# Patient Record
Sex: Male | Born: 1970 | Race: White | Hispanic: No | Marital: Married | Smoking: Never smoker
Health system: Southern US, Community
[De-identification: ages and names within clinical notes are randomized; demographics above are authoritative.]

## PROBLEM LIST (undated history)

## (undated) DIAGNOSIS — N200 Calculus of kidney: Secondary | ICD-10-CM

## (undated) DIAGNOSIS — R259 Unspecified abnormal involuntary movements: Secondary | ICD-10-CM

## (undated) DIAGNOSIS — J45909 Unspecified asthma, uncomplicated: Secondary | ICD-10-CM

## (undated) DIAGNOSIS — E785 Hyperlipidemia, unspecified: Secondary | ICD-10-CM

## (undated) DIAGNOSIS — T7840XA Allergy, unspecified, initial encounter: Secondary | ICD-10-CM

## (undated) DIAGNOSIS — M545 Low back pain, unspecified: Secondary | ICD-10-CM

## (undated) DIAGNOSIS — K219 Gastro-esophageal reflux disease without esophagitis: Secondary | ICD-10-CM

## (undated) HISTORY — DX: Calculus of kidney: N20.0

## (undated) HISTORY — DX: Low back pain, unspecified: M54.50

## (undated) HISTORY — DX: Allergy, unspecified, initial encounter: T78.40XA

## (undated) HISTORY — DX: Gastro-esophageal reflux disease without esophagitis: K21.9

## (undated) HISTORY — DX: Low back pain: M54.5

## (undated) HISTORY — DX: Hyperlipidemia, unspecified: E78.5

## (undated) HISTORY — DX: Unspecified asthma, uncomplicated: J45.909

## (undated) HISTORY — PX: TONSILLECTOMY: SUR1361

## (undated) HISTORY — DX: Unspecified abnormal involuntary movements: R25.9

---

## 1998-10-24 ENCOUNTER — Encounter: Payer: Self-pay | Admitting: Emergency Medicine

## 1998-10-24 ENCOUNTER — Emergency Department (HOSPITAL_COMMUNITY): Admission: EM | Admit: 1998-10-24 | Discharge: 1998-10-24 | Payer: Self-pay | Admitting: Emergency Medicine

## 2000-03-23 ENCOUNTER — Emergency Department (HOSPITAL_COMMUNITY): Admission: EM | Admit: 2000-03-23 | Discharge: 2000-03-23 | Payer: Self-pay | Admitting: Emergency Medicine

## 2000-12-06 ENCOUNTER — Encounter: Admission: RE | Admit: 2000-12-06 | Discharge: 2000-12-06 | Payer: Self-pay | Admitting: Orthopedic Surgery

## 2000-12-06 ENCOUNTER — Encounter: Payer: Self-pay | Admitting: Orthopedic Surgery

## 2000-12-22 ENCOUNTER — Encounter: Admission: RE | Admit: 2000-12-22 | Discharge: 2000-12-22 | Payer: Self-pay | Admitting: Orthopedic Surgery

## 2000-12-22 ENCOUNTER — Encounter: Payer: Self-pay | Admitting: Orthopedic Surgery

## 2003-09-06 HISTORY — PX: WISDOM TOOTH EXTRACTION: SHX21

## 2004-04-27 ENCOUNTER — Emergency Department (HOSPITAL_COMMUNITY): Admission: EM | Admit: 2004-04-27 | Discharge: 2004-04-27 | Payer: Self-pay | Admitting: Emergency Medicine

## 2004-09-05 HISTORY — PX: BACK SURGERY: SHX140

## 2004-11-12 ENCOUNTER — Ambulatory Visit (HOSPITAL_COMMUNITY): Admission: RE | Admit: 2004-11-12 | Discharge: 2004-11-12 | Payer: Self-pay | Admitting: Orthopaedic Surgery

## 2006-03-12 ENCOUNTER — Emergency Department (HOSPITAL_COMMUNITY): Admission: EM | Admit: 2006-03-12 | Discharge: 2006-03-12 | Payer: Self-pay | Admitting: Emergency Medicine

## 2006-05-22 ENCOUNTER — Ambulatory Visit: Payer: Self-pay | Admitting: Internal Medicine

## 2010-02-02 ENCOUNTER — Encounter: Admission: RE | Admit: 2010-02-02 | Discharge: 2010-02-02 | Payer: Self-pay | Admitting: Orthopedic Surgery

## 2010-09-26 ENCOUNTER — Encounter: Payer: Self-pay | Admitting: Family Medicine

## 2011-01-21 NOTE — Assessment & Plan Note (Signed)
[facility]                               PULMONARY OFFICE NOTE   NAME:[name] [name]                        MRN:          [id number]  DATE:05/22/2006                            DOB:          05-03-1971    REASON FOR CONSULTATION:  Cough.   HISTORY:  A 40 year old white male, never smoker, with paroxysms of cough  over the last six years, previously diagnosed with bronchitis and treated  now with Advair with some benefit.  However, his main symptoms tend to occur  while he is sleeping associated with a sensation of  choking  for which he  sits up with or without the use of inhaler.  It is just as likely to improve  spontaneously as after an inhaler.  He denies any significant reproducible  dyspnea with exertion, but has only these choking spells occurring at  rest.  He says they are almost gone since starting Advair (note that he  has also taking Nexium although consistently).  See below.   PAST MEDICAL HISTORY:  Significant for remote back surgery.   ALLERGIES:  None known.   MEDICATIONS:  1. Advair 250/50 one b.i.d.  2. Nexium 40 mg daily.   SOCIAL HISTORY:  He has never smoked.  He works in Event organiser.   FAMILY HISTORY:  Significant for the absence of respiratory disease  __________ .   REVIEW OF SYSTEMS:  Taken in detail on the work sheet.  Significant for  __________  as outlined above.  The patient states he is feeling quite a  bit better now but still has a sensation of a lump in his throat that  even taking Advair does not seem to help.   PHYSICAL EXAMINATION:  GENERAL:  This is a slightly anxious white male in no  acute distress.  VITAL SIGNS:  Stable.  HEENT:  Significant for the fact that he is chewing mint gum.  Oropharynx,  however, is clear.  NECK:  Supple without cervical adenopathy or tenderness.  Trachea is  midline.  No thyromegaly.  CHEST:  Lung fields perfectly clear bilaterally to auscultation.  HEART:  Regular  rhythm without murmur, gallop or rub.  ABDOMEN:  Soft, benign.  EXTREMITIES:  Warm without calf tenderness, cyanosis or clubbing or edema.   IMPRESSION:  New onset cough dating back approximately six years and in this  never smoker who has gained significant weight as he is in his mid 30s and  also is actively consuming excessive mint, which relaxes the  gastroesophageal valve and places him at risk for reflux.  I cannot identify  any other risk factors for asthma and, therefore, recommend the following:   1. Try taking Nexium perfectly regularly every day before breakfast along      with dietary and lifestyle modifications I have reviewed with him in      writing.  2. Taper off Advair over the next two weeks and then return in four weeks      for methacholine challenge test for definitive diagnosis of whether or      not asthma  is a primary diagnosis or a secondary to reflux.                                   Charlaine Dalton. Sherene Sires, MD, [facility]   MBW/MedQ  DD:  05/23/2006  DT:  05/24/2006  Job #:  [id number]   cc:   [name] [name], M.D.

## 2011-01-21 NOTE — H&P (Signed)
[name], [name] NO.:  [id number]   MEDICAL RECORD NO.:  [id number]          PATIENT TYPE:  OIB   LOCATION:                               FACILITY:  [facility]   PHYSICIAN:  Sharolyn Douglas, M.D.        DATE OF BIRTH:  1970/11/27   DATE OF ADMISSION:  11/12/2004  DATE OF DISCHARGE:                                HISTORY & PHYSICAL   CHIEF COMPLAINT:  Pain in my back and right leg.   HISTORY OF PRESENT ILLNESS:  This 40 year old male seen by [place] for continued  progressive problems concerning pain into his low back and radiation into  right lower extremity.  Patient was injured at work when he was adjusting a  table saw and leaning over.  He had sudden onset of excruciating pain into  his low back to the point where he could not straighten up.  He was unable  to ambulate as well.  He was eventually seen in the emergency room where he  was eventually x-rayed and degenerative disk disease was seen as well as a  herniated nucleus pulposus seen at L4-L5.  The patient has had epidural  steroid injections, anti-inflammatories, analgesics, physical therapy,  muscle relaxers, and modification of his every day activity.  His primary  treatment has been through Dr. Malon Kindle and through Dr. Ethelene Hal.  When we  saw him the patient had ambulation with an antalgic gait to the right.  He  has loss of sensation in the L5 distribution to the right lower extremity.  He has a positive straight leg raise on the right which is aggravated by  dorsal augmentation.  After much consideration and the fact that  conservative treatment has failed it was felt that he would benefit with  surgical intervention and is having a micro endoscopic diskectomy at the L4-  5 level on the right.   PAST MEDICAL HISTORY:  This patient's physician is Dr. __________ [facility] who has cleared him for surgery.   CURRENT MEDICATIONS:  1.  Robaxin.  2.  Ambien p.r.n.   ALLERGIES:   None.   PAST MEDICAL HISTORY:  He has been in good health.  He has a history of  bronchitis about a year ago with no sequelae.   PAST SURGICAL HISTORY:  Wisdom teeth extraction in February 2005.   FAMILY HISTORY:  Noncontributory.   SOCIAL HISTORY:  The patient is married.  He is a Environmental consultant.  Has  no intake of tobacco products.  Has rare intake of ETOH.  His wife, [name],  will be the major caregiver after surgery.   REVIEW OF SYSTEMS:  CNS:  No seizures, shoulder paralysis, double vision.  The patient does have radiculitis consistent with L5 nerve root irritation  from HNP on the right.  CARDIOVASCULAR:  No chest pain, angina, orthopnea.  RESPIRATORY:  No productive cough.  No hemoptysis.  No shortness of breath.  GASTROINTESTINAL:  No nausea, vomiting, melena, bloody stool.  GENITOURINARY:  No discharge, dysuria, hematuria.  MUSCULOSKELETAL:  Primarily in  present illness.   PHYSICAL EXAMINATION:  GENERAL:  Alert, cooperative, friendly 39 year old  white male who is accompanied by his wife.  VITAL SIGNS:  VITAL SIGNS:  Blood pressure 122/82, pulse 80, respirations  12.  HEENT:  Normocephalic.  PERRLA.  EOM intact.  Oropharynx is clear.  CHEST:  Clear to auscultation.  No rhonchi.  No rales.  HEART:  Regular rate and rhythm.  No murmurs are heard.  ABDOMEN:  Soft, nontender.  Liver, spleen not felt.  GENITALIA:  Not done.  Not pertinent to present illness.  RECTAL:  Not done.  Not pertinent to present illness.  EXTREMITIES:  Lower extremities as in present illness above.   ADMITTING DIAGNOSES:  L4-5 herniated nucleus pulposus on the right.   PLAN:  The patient will undergo right L4 micro endoscopic diskectomy.      DLU/MEDQ  D:  11/04/2004  T:  11/04/2004  Job:  [id number]   cc:   Silvestre Moment, M.D.  [facility] at [facility]  fax [phone]

## 2011-01-21 NOTE — Op Note (Signed)
NAMEJAFET, WISSING NO.:  [id number]   MEDICAL RECORD NO.:  [id number]          PATIENT TYPE:  OIB   LOCATION:  2852                         FACILITY:  [facility]   PHYSICIAN:  Sharolyn Douglas, M.D.        DATE OF BIRTH:  1971-05-31   DATE OF PROCEDURE:  11/12/2004  DATE OF DISCHARGE:                                 OPERATIVE REPORT   DIAGNOSIS:  Right L4-5 disk herniation with right lower extremity  radiculopathy.   PROCEDURE:  Right L4-5 microdiskectomy using the ___________ retractor  system.   SURGEON:  Sharolyn Douglas, M.D.   ASSISTANT:  Verlin Fester, P.A.   ANESTHESIA:  General endotracheal.   COMPLICATIONS:  None.   INDICATIONS:  The patient is a pleasant 40 year old male with persistent  right lower extremity radiculopathy secondary to disk herniation at L4-5. He  failed to respond to conservative measures, elected to undergo L4-5 micro  laminotomy and diskectomy in hopes of improving symptoms. Risks and benefits  were reviewed.   PROCEDURE:  The patient was identified in the holding area, taken to the  operating room, underwent general endotracheal anesthesia without  difficulty. Carefully positioned prone on the Wilson frame. All bony  prominences were padded. Face and eyes protected at all times. Back prepped  and draped usual sterile fashion. Fluoroscopy was brought into the field and  the L4-5 level was identified. A 2 cm skin incision made just off the  midline on the right side. Dissection was carried through the deep fascia  sharply. Dilators from the ____________ retractor system was then used to  dock onto the L4-5 interspace using fluoroscopy. The length 50-mm blades  were then attached to the ____________ retractor which was slid over the  final dilator and attached to the operating room table using the attachment  arm. The retractor was spread dilating the paraspinal muscles. The surgical  microscope was then brought into the field. Final  fluoroscopic imaging  confirmed good positioning of the retractor over the L4-5 interspace. A thin  layer of paraspinal muscle removed over the L4 lamina. High-speed bur used  to remove the inferior one third of the L4 lamina. The ligamentum flavum was  removed piecemeal. This exposed the underlying L5 nerve root. The nerve root  was being displaced posteriorly by the underlying disk herniation. The nerve  root was mobilized medially. The subligamentous disk herniation was  identified. Sharp annulotomy completed.  Pituitary rongeurs used to remove  several large fragments of disk material. We then used an Epstein curette to  decompress the remaining disk herniation back into the interspace which  again was removed with pituitaries. The wound was irrigated. We explored the  nerve root and spinal canal with a blunt probe and felt we had a good  decompression. Bleeding was controlled with Gelfoam and bipolar electro  cautery. Fentanyl 2 mL left in the exposed epidural space for  postoperative analgesia. The deep fascia was closed with an 0 Vicryl suture.  Subcutaneous layer closed with 2-0 Vicryl followed by Dermabond on the skin.  The patient was turned supine, extubated  without difficulty and transferred  to recovery in stable condition able to move his upper and lower  extremities.      MC/MEDQ  D:  11/12/2004  T:  11/12/2004  Job:  [id number]

## 2012-04-20 ENCOUNTER — Encounter (INDEPENDENT_AMBULATORY_CARE_PROVIDER_SITE_OTHER): Payer: Self-pay | Admitting: Surgery

## 2012-04-23 ENCOUNTER — Telehealth (INDEPENDENT_AMBULATORY_CARE_PROVIDER_SITE_OTHER): Payer: Self-pay

## 2012-04-23 NOTE — Telephone Encounter (Signed)
Called in wanting sooner appointment because symptoms of nausea with hernia all weekend. Asked if hernia was reducible and states "no bulge to push in to see if reducible". No sooner appointment but told them I would look out for cancellations and none used cancer spots. If symptoms worsens or do not improve, told to go to ER or an urgent care. Also advised to take MOM or Miralax since complaining of a little constipation but bowels are moving. Told them I would have Pattricia Boss look at [facility] schedule to see if any spots for next week can be used and either her or I will call them back.

## 2012-04-26 ENCOUNTER — Encounter (INDEPENDENT_AMBULATORY_CARE_PROVIDER_SITE_OTHER): Payer: Self-pay | Admitting: Surgery

## 2012-04-30 ENCOUNTER — Ambulatory Visit (INDEPENDENT_AMBULATORY_CARE_PROVIDER_SITE_OTHER): Payer: 59 | Admitting: Surgery

## 2012-04-30 ENCOUNTER — Encounter (INDEPENDENT_AMBULATORY_CARE_PROVIDER_SITE_OTHER): Payer: Self-pay | Admitting: Surgery

## 2012-04-30 VITALS — BP 142/88 | HR 68 | Temp 98.0°F | Resp 20 | Ht 70.0 in | Wt 199.0 lb

## 2012-04-30 DIAGNOSIS — K4090 Unilateral inguinal hernia, without obstruction or gangrene, not specified as recurrent: Secondary | ICD-10-CM

## 2012-04-30 NOTE — Progress Notes (Signed)
Patient ID: [name], male   DOB: 1971/03/04, 41 y.o.   MRN: [id number]  Chief Complaint  Patient presents with  . Pre-op Exam    Eval L/H    HPI [name] is a 41 y.o. male.   HPIThis is a pleasant gentleman referred by Dr. Ancil Boozer for evaluation of a inguinal hernia. The patient has noticed a bulge in his left groin several weeks ago. He was in the shower when he noticed it. He reports that it always reduces easily. He has very mild discomfort in the left groin. He has no previous surgical history regarding hernias. He has no complaints regarding his umbilicus or right groin  Past Medical History  Diagnosis Date  . Hyperlipidemia   . GERD (gastroesophageal reflux disease)   . Asthma   . Nephrolithiasis   . Lumbago   . Allergy   . Abnormal involuntary movements     History reviewed. No pertinent past surgical history.  History reviewed. No pertinent family history.  Social History History  Substance Use Topics  . Smoking status: Never Smoker   . Smokeless tobacco: Not on file  . Alcohol Use: Yes    No Known Allergies  Current Outpatient Prescriptions  Medication Sig Dispense Refill  . fish oil-omega-3 fatty acids 1000 MG capsule Take 2 g by mouth daily.      Marland Kitchen ibuprofen (ADVIL,MOTRIN) 200 MG tablet Take 200 mg by mouth every 6 (six) hours as needed.      . Multiple Vitamins-Minerals (MULTIVITAMIN WITH MINERALS) tablet Take 1 tablet by mouth daily.      . ondansetron (ZOFRAN) 4 MG tablet         Review of Systems Review of Systems  Constitutional: Negative for fever, chills and unexpected weight change.  HENT: Negative for hearing loss, congestion, sore throat, trouble swallowing and voice change.   Eyes: Negative for visual disturbance.  Respiratory: Negative for cough and wheezing.   Cardiovascular: Negative for chest pain, palpitations and leg swelling.  Gastrointestinal: Positive for abdominal pain, constipation and abdominal distention. Negative  for nausea, vomiting, diarrhea, blood in stool, anal bleeding and rectal pain.  Genitourinary: Negative for hematuria and difficulty urinating.  Musculoskeletal: Negative for arthralgias.  Skin: Negative for rash and wound.  Neurological: Negative for seizures, syncope, weakness and headaches.  Hematological: Negative for adenopathy. Does not bruise/bleed easily.  Psychiatric/Behavioral: Negative for confusion.    Blood pressure 142/88, pulse 68, temperature 98 F (36.7 C), temperature source Temporal, resp. rate 20, height 5\' 10"  (1.778 m), weight 199 lb (90.266 kg), SpO2 98.00%.  Physical Exam Physical Exam  Constitutional: He is oriented to person, place, and time. He appears well-developed and well-nourished. No distress.  HENT:  Head: Normocephalic and atraumatic.  Right Ear: External ear normal.  Left Ear: External ear normal.  Nose: Nose normal.  Mouth/Throat: Oropharynx is clear and moist. No oropharyngeal exudate.  Eyes: Conjunctivae are normal. Pupils are equal, round, and reactive to light. Right eye exhibits no discharge. Left eye exhibits no discharge. No scleral icterus.  Neck: Normal range of motion. Neck supple. No tracheal deviation present.  Cardiovascular: Normal rate, regular rhythm, normal heart sounds and intact distal pulses.   No murmur heard. Pulmonary/Chest: Effort normal and breath sounds normal. No respiratory distress. He has no wheezes. He has no rales.  Abdominal: Bowel sounds are normal. He exhibits no distension. There is no tenderness. There is no guarding.       There is an easily reducible  left inguinal hernia. There is no evidence of right single hernia or umbilical hernia  Musculoskeletal: Normal range of motion. He exhibits no edema and no tenderness.  Lymphadenopathy:    He has no cervical adenopathy.  Neurological: He is alert and oriented to person, place, and time.  Skin: Skin is warm and dry. No rash noted. He is not diaphoretic. No  erythema.  Psychiatric: His behavior is normal. Judgment normal.    Data Reviewed I have the notes from her primary care physician which I have reviewed  Assessment     Left inguinal hernia  Plan    I discussed the diagnosis of hernia with the patient and his wife. I discussed with the laparoscopic repairs with mesh. I discussed the risk of surgery which includes but is not limited to bleeding, infection, nerve entrapment, chronic pain, recurrence, etc. He understands and wishes to proceed with open repair with mesh. Left inguinal hernia repair with mesh will thus be scheduled. Likelihood of success is good       [name] A 04/30/2012, 3:25 PM

## 2012-05-10 ENCOUNTER — Ambulatory Visit (INDEPENDENT_AMBULATORY_CARE_PROVIDER_SITE_OTHER): Payer: Self-pay | Admitting: Surgery

## 2012-05-11 ENCOUNTER — Encounter (HOSPITAL_BASED_OUTPATIENT_CLINIC_OR_DEPARTMENT_OTHER): Payer: Self-pay | Admitting: *Deleted

## 2012-05-11 NOTE — Progress Notes (Signed)
No cardiac or resp problems Used to have chronic cough-thought was reflux-treated-he chg jobs with less environmental pollution-better

## 2012-05-15 NOTE — H&P (Signed)
Patient ID: [name], male DOB: November 22, 1970, 41 y.o. MRN: [id number]  Chief Complaint   Patient presents with   .  Pre-op Exam     Eval L/H    HPI  [name] is a 41 y.o. male.  HPIThis is a pleasant gentleman referred by Dr. Ancil Boozer for evaluation of a inguinal hernia. The patient has noticed a bulge in his left groin several weeks ago. He was in the shower when he noticed it. He reports that it always reduces easily. He has very mild discomfort in the left groin. He has no previous surgical history regarding hernias. He has no complaints regarding his umbilicus or right groin  Past Medical History   Diagnosis  Date   .  Hyperlipidemia    .  GERD (gastroesophageal reflux disease)    .  Asthma    .  Nephrolithiasis    .  Lumbago    .  Allergy    .  Abnormal involuntary movements     History reviewed. No pertinent past surgical history.  History reviewed. No pertinent family history.  Social History  History   Substance Use Topics   .  Smoking status:  Never Smoker   .  Smokeless tobacco:  Not on file   .  Alcohol Use:  Yes    No Known Allergies  Current Outpatient Prescriptions   Medication  Sig  Dispense  Refill   .  fish oil-omega-3 fatty acids 1000 MG capsule  Take 2 g by mouth daily.     Marland Kitchen  ibuprofen (ADVIL,MOTRIN) 200 MG tablet  Take 200 mg by mouth every 6 (six) hours as needed.     .  Multiple Vitamins-Minerals (MULTIVITAMIN WITH MINERALS) tablet  Take 1 tablet by mouth daily.     .  ondansetron (ZOFRAN) 4 MG tablet       Review of Systems  Review of Systems  Constitutional: Negative for fever, chills and unexpected weight change.  HENT: Negative for hearing loss, congestion, sore throat, trouble swallowing and voice change.  Eyes: Negative for visual disturbance.  Respiratory: Negative for cough and wheezing.  Cardiovascular: Negative for chest pain, palpitations and leg swelling.  Gastrointestinal: Positive for abdominal pain, constipation and  abdominal distention. Negative for nausea, vomiting, diarrhea, blood in stool, anal bleeding and rectal pain.  Genitourinary: Negative for hematuria and difficulty urinating.  Musculoskeletal: Negative for arthralgias.  Skin: Negative for rash and wound.  Neurological: Negative for seizures, syncope, weakness and headaches.  Hematological: Negative for adenopathy. Does not bruise/bleed easily.  Psychiatric/Behavioral: Negative for confusion.   Blood pressure 142/88, pulse 68, temperature 98 F (36.7 C), temperature source Temporal, resp. rate 20, height 5\' 10"  (1.778 m), weight 199 lb (90.266 kg), SpO2 98.00%.  Physical Exam  Physical Exam  Constitutional: He is oriented to person, place, and time. He appears well-developed and well-nourished. No distress.  HENT:  Head: Normocephalic and atraumatic.  Right Ear: External ear normal.  Left Ear: External ear normal.  Nose: Nose normal.  Mouth/Throat: Oropharynx is clear and moist. No oropharyngeal exudate.  Eyes: Conjunctivae are normal. Pupils are equal, round, and reactive to light. Right eye exhibits no discharge. Left eye exhibits no discharge. No scleral icterus.  Neck: Normal range of motion. Neck supple. No tracheal deviation present.  Cardiovascular: Normal rate, regular rhythm, normal heart sounds and intact distal pulses.  No murmur heard.  Pulmonary/Chest: Effort normal and breath sounds normal. No respiratory distress. He has no wheezes.  He has no rales.  Abdominal: Bowel sounds are normal. He exhibits no distension. There is no tenderness. There is no guarding.  There is an easily reducible left inguinal hernia. There is no evidence of right single hernia or umbilical hernia  Musculoskeletal: Normal range of motion. He exhibits no edema and no tenderness.  Lymphadenopathy:  He has no cervical adenopathy.  Neurological: He is alert and oriented to person, place, and time.  Skin: Skin is warm and dry. No rash noted. He is not  diaphoretic. No erythema.  Psychiatric: His behavior is normal. Judgment normal.   Data Reviewed  I have the notes from her primary care physician which I have reviewed  Assessment   Left inguinal hernia  Plan   I discussed the diagnosis of hernia with the patient and his wife. I discussed with the laparoscopic repairs with mesh. I discussed the risk of surgery which includes but is not limited to bleeding, infection, nerve entrapment, chronic pain, recurrence, etc. He understands and wishes to proceed with open repair with mesh. Left inguinal hernia repair with mesh will thus be scheduled. Likelihood of success is good   [place] A

## 2012-05-16 ENCOUNTER — Encounter (HOSPITAL_BASED_OUTPATIENT_CLINIC_OR_DEPARTMENT_OTHER)
Admission: RE | Disposition: A | Discharge status: Home / Self Care | Payer: Self-pay | Source: Ambulatory Visit | Attending: Surgery

## 2012-05-16 ENCOUNTER — Encounter (HOSPITAL_BASED_OUTPATIENT_CLINIC_OR_DEPARTMENT_OTHER): Payer: Self-pay | Admitting: *Deleted

## 2012-05-16 ENCOUNTER — Ambulatory Visit (HOSPITAL_BASED_OUTPATIENT_CLINIC_OR_DEPARTMENT_OTHER): Payer: 59 | Admitting: *Deleted

## 2012-05-16 ENCOUNTER — Encounter (HOSPITAL_BASED_OUTPATIENT_CLINIC_OR_DEPARTMENT_OTHER): Payer: Self-pay

## 2012-05-16 ENCOUNTER — Ambulatory Visit (HOSPITAL_BASED_OUTPATIENT_CLINIC_OR_DEPARTMENT_OTHER)
Admission: RE | Admit: 2012-05-16 | Discharge: 2012-05-16 | Disposition: A | Discharge status: Home / Self Care | Payer: 59 | Source: Ambulatory Visit | Attending: Surgery | Admitting: Surgery

## 2012-05-16 DIAGNOSIS — K4090 Unilateral inguinal hernia, without obstruction or gangrene, not specified as recurrent: Secondary | ICD-10-CM | POA: Insufficient documentation

## 2012-05-16 DIAGNOSIS — K219 Gastro-esophageal reflux disease without esophagitis: Secondary | ICD-10-CM | POA: Insufficient documentation

## 2012-05-16 HISTORY — PX: HERNIA REPAIR: SHX51

## 2012-05-16 HISTORY — PX: INGUINAL HERNIA REPAIR: SHX194

## 2012-05-16 LAB — POCT HEMOGLOBIN-HEMACUE: Hemoglobin: 13.3 g/dL (ref 13.0–17.0)

## 2012-05-16 SURGERY — REPAIR, HERNIA, INGUINAL, ADULT
Anesthesia: General | Site: Groin | Laterality: Left | Wound class: Clean

## 2012-05-16 MED ORDER — DEXAMETHASONE SODIUM PHOSPHATE 4 MG/ML IJ SOLN
INTRAMUSCULAR | Status: DC | PRN
  Administered 2012-05-16: 10 mg via INTRAVENOUS

## 2012-05-16 MED ORDER — OXYCODONE HCL 5 MG PO TABS: 5.0000 mg | ORAL_TABLET | ORAL | Status: DC | PRN

## 2012-05-16 MED ORDER — SODIUM CHLORIDE 0.9 % IJ SOLN: 3.0000 mL | INTRAMUSCULAR | Status: DC | PRN

## 2012-05-16 MED ORDER — SODIUM CHLORIDE 0.9 % IJ SOLN: 3.0000 mL | Freq: Two times a day (BID) | INTRAMUSCULAR | Status: DC

## 2012-05-16 MED ORDER — ACETAMINOPHEN 10 MG/ML IV SOLN
1000.0000 mg | Freq: Once | INTRAVENOUS | Status: AC
  Administered 2012-05-16: 1000 mg via INTRAVENOUS

## 2012-05-16 MED ORDER — MEPERIDINE HCL 25 MG/ML IJ SOLN: 6.2500 mg | INTRAMUSCULAR | Status: DC | PRN

## 2012-05-16 MED ORDER — OXYCODONE HCL 5 MG PO TABS
5.0000 mg | ORAL_TABLET | Freq: Once | ORAL | Status: AC | PRN
  Administered 2012-05-16: 5 mg via ORAL

## 2012-05-16 MED ORDER — ONDANSETRON HCL 4 MG/2ML IJ SOLN: 4.0000 mg | Freq: Four times a day (QID) | INTRAMUSCULAR | Status: DC | PRN

## 2012-05-16 MED ORDER — ACETAMINOPHEN 325 MG PO TABS: 650.0000 mg | ORAL_TABLET | ORAL | Status: DC | PRN

## 2012-05-16 MED ORDER — BUPIVACAINE-EPINEPHRINE PF 0.5-1:200000 % IJ SOLN
INTRAMUSCULAR | Status: DC | PRN
  Administered 2012-05-16: 20 mL

## 2012-05-16 MED ORDER — ACETAMINOPHEN 650 MG RE SUPP: 650.0000 mg | RECTAL | Status: DC | PRN

## 2012-05-16 MED ORDER — CEFAZOLIN SODIUM-DEXTROSE 2-3 GM-% IV SOLR
2.0000 g | INTRAVENOUS | Status: AC
  Administered 2012-05-16: 2 g via INTRAVENOUS

## 2012-05-16 MED ORDER — LACTATED RINGERS IV SOLN
INTRAVENOUS | Status: DC
  Administered 2012-05-16 (×2): via INTRAVENOUS

## 2012-05-16 MED ORDER — HYDROCODONE-ACETAMINOPHEN 5-325 MG PO TABS: 1.0000 | ORAL_TABLET | ORAL | Status: AC | PRN

## 2012-05-16 MED ORDER — MIDAZOLAM HCL 2 MG/2ML IJ SOLN
1.0000 mg | INTRAMUSCULAR | Status: DC | PRN
  Administered 2012-05-16: 2 mg via INTRAVENOUS

## 2012-05-16 MED ORDER — FENTANYL CITRATE 0.05 MG/ML IJ SOLN
INTRAMUSCULAR | Status: DC | PRN
  Administered 2012-05-16: 50 ug via INTRAVENOUS

## 2012-05-16 MED ORDER — SODIUM CHLORIDE 0.9 % IV SOLN: 250.0000 mL | INTRAVENOUS | Status: DC | PRN

## 2012-05-16 MED ORDER — MORPHINE SULFATE 4 MG/ML IJ SOLN: 4.0000 mg | INTRAMUSCULAR | Status: DC | PRN

## 2012-05-16 MED ORDER — ONDANSETRON HCL 4 MG/2ML IJ SOLN
INTRAMUSCULAR | Status: DC | PRN
  Administered 2012-05-16: 4 mg via INTRAVENOUS

## 2012-05-16 MED ORDER — HYDROMORPHONE HCL PF 1 MG/ML IJ SOLN
0.2500 mg | INTRAMUSCULAR | Status: DC | PRN
  Administered 2012-05-16 (×3): 0.5 mg via INTRAVENOUS

## 2012-05-16 MED ORDER — LIDOCAINE HCL (CARDIAC) 20 MG/ML IV SOLN
INTRAVENOUS | Status: DC | PRN
  Administered 2012-05-16: 40 mg via INTRAVENOUS

## 2012-05-16 MED ORDER — OXYCODONE HCL 5 MG/5ML PO SOLN: 5.0000 mg | Freq: Once | ORAL | Status: AC | PRN

## 2012-05-16 MED ORDER — MIDAZOLAM HCL 2 MG/2ML IJ SOLN: 0.5000 mg | Freq: Once | INTRAMUSCULAR | Status: DC | PRN

## 2012-05-16 MED ORDER — PROMETHAZINE HCL 25 MG/ML IJ SOLN: 6.2500 mg | INTRAMUSCULAR | Status: DC | PRN

## 2012-05-16 MED ORDER — FENTANYL CITRATE 0.05 MG/ML IJ SOLN
50.0000 ug | INTRAMUSCULAR | Status: DC | PRN
  Administered 2012-05-16: 100 ug via INTRAVENOUS

## 2012-05-16 MED ORDER — PROPOFOL 10 MG/ML IV BOLUS
INTRAVENOUS | Status: DC | PRN
  Administered 2012-05-16: 200 mg via INTRAVENOUS

## 2012-05-16 MED ORDER — BUPIVACAINE HCL (PF) 0.5 % IJ SOLN
INTRAMUSCULAR | Status: DC | PRN
  Administered 2012-05-16: 10 mL

## 2012-05-16 SURGICAL SUPPLY — 43 items
BENZOIN TINCTURE PRP APPL 2/3 (GAUZE/BANDAGES/DRESSINGS) ×2 IMPLANT
BLADE HEX COATED 2.75 (ELECTRODE) ×2 IMPLANT
BLADE SURG 10 STRL SS (BLADE) ×2 IMPLANT
BLADE SURG ROTATE 9660 (MISCELLANEOUS) ×2 IMPLANT
CHLORAPREP W/TINT 26ML (MISCELLANEOUS) ×2 IMPLANT
CLOTH BEACON ORANGE TIMEOUT ST (SAFETY) ×2 IMPLANT
COVER MAYO STAND STRL (DRAPES) ×2 IMPLANT
COVER TABLE BACK 60X90 (DRAPES) ×2 IMPLANT
DECANTER SPIKE VIAL GLASS SM (MISCELLANEOUS) ×2 IMPLANT
DRAIN PENROSE 1/2X12 LTX STRL (WOUND CARE) ×2 IMPLANT
DRAPE PED LAPAROTOMY (DRAPES) ×2 IMPLANT
DRAPE UTILITY XL STRL (DRAPES) ×2 IMPLANT
DRSG TEGADERM 4X4.75 (GAUZE/BANDAGES/DRESSINGS) ×2 IMPLANT
ELECT REM PT RETURN 9FT ADLT (ELECTROSURGICAL) ×2
ELECTRODE REM PT RTRN 9FT ADLT (ELECTROSURGICAL) ×1 IMPLANT
GAUZE SPONGE 4X4 12PLY STRL LF (GAUZE/BANDAGES/DRESSINGS) ×2 IMPLANT
GLOVE ECLIPSE 6.5 STRL STRAW (GLOVE) ×2 IMPLANT
GLOVE SURG SIGNA 7.5 PF LTX (GLOVE) ×2 IMPLANT
GOWN PREVENTION PLUS XLARGE (GOWN DISPOSABLE) ×2 IMPLANT
GOWN PREVENTION PLUS XXLARGE (GOWN DISPOSABLE) ×2 IMPLANT
MESH PARIETEX PROGRIP LEFT (Mesh General) ×2 IMPLANT
NEEDLE HYPO 22GX1.5 SAFETY (NEEDLE) ×2 IMPLANT
NEEDLE HYPO 25X1 1.5 SAFETY (NEEDLE) ×2 IMPLANT
NS IRRIG 1000ML POUR BTL (IV SOLUTION) IMPLANT
PACK BASIN DAY SURGERY FS (CUSTOM PROCEDURE TRAY) ×2 IMPLANT
PENCIL BUTTON HOLSTER BLD 10FT (ELECTRODE) ×2 IMPLANT
SLEEVE SCD COMPRESS KNEE MED (MISCELLANEOUS) IMPLANT
SPONGE INTESTINAL PEANUT (DISPOSABLE) IMPLANT
SPONGE LAP 4X18 X RAY DECT (DISPOSABLE) ×2 IMPLANT
STRIP CLOSURE SKIN 1/2X4 (GAUZE/BANDAGES/DRESSINGS) ×2 IMPLANT
SUT MNCRL AB 4-0 PS2 18 (SUTURE) ×2 IMPLANT
SUT SILK 2 0 SH (SUTURE) ×2 IMPLANT
SUT SURG 0 T 19/GS 22 1969 62 (SUTURE) IMPLANT
SUT VIC AB 2-0 CT1 27 (SUTURE) ×1
SUT VIC AB 2-0 CT1 TAPERPNT 27 (SUTURE) ×1 IMPLANT
SUT VIC AB 3-0 CT1 27 (SUTURE) ×1
SUT VIC AB 3-0 CT1 27XBRD (SUTURE) ×1 IMPLANT
SUT VICRYL AB 3 0 TIES (SUTURE) ×2 IMPLANT
SYR BULB 3OZ (MISCELLANEOUS) IMPLANT
SYR CONTROL 10ML LL (SYRINGE) ×2 IMPLANT
TOWEL OR 17X24 6PK STRL BLUE (TOWEL DISPOSABLE) ×4 IMPLANT
TOWEL OR NON WOVEN STRL DISP B (DISPOSABLE) ×2 IMPLANT
WATER STERILE IRR 1000ML POUR (IV SOLUTION) ×2 IMPLANT

## 2012-05-16 NOTE — Anesthesia Procedure Notes (Addendum)
Anesthesia Regional Block:  TAP block  Pre-Anesthetic Checklist: ,, timeout performed, Correct Patient, Correct Site, Correct Laterality, Correct Procedure, Correct Position, site marked, Risks and benefits discussed,  Surgical consent,  Pre-op evaluation,  At surgeon's request and post-op pain management  Laterality: Left  Prep: chloraprep       Needles:  Injection technique: Single-shot  Needle Type: Echogenic Needle     Needle Length: 5cm 5 cm Needle Gauge: 22 and 22 G    Additional Needles:  Procedures: ultrasound guided TAP block Narrative:  Start time: 05/16/2012 2:24 PM End time: 05/16/2012 2:31 PM Injection made incrementally with aspirations every 5 mL.  Performed by: Personally  Anesthesiologist: Sandford Craze, MD  Additional Notes: Pt identified in Holding room.  Monitors applied. Working IV access confirmed. Sterile prep L flank.  #22ga Echogenic needle: TAP block with US guidance.  20cc 0.5% Bupivacaine with 1:200k epi injected incrementally after negative test dose.  Patient asymptomatic, VSS, no heme aspirated, tolerated well.   Sandford Craze, MD  TAP block Procedure Name: LMA Insertion Date/Time: 05/16/2012 2:49 PM Performed by: Meyer Russel Pre-anesthesia Checklist: Patient identified, Emergency Drugs available, Suction available and Patient being monitored Patient Re-evaluated:Patient Re-evaluated prior to inductionOxygen Delivery Method: Circle System Utilized Preoxygenation: Pre-oxygenation with 100% oxygen Intubation Type: IV induction Ventilation: Mask ventilation without difficulty LMA: LMA inserted LMA Size: 5.0 Number of attempts: 1 Airway Equipment and Method: bite block Placement Confirmation: positive ETCO2 and breath sounds checked- equal and bilateral Tube secured with: Tape Dental Injury: Teeth and Oropharynx as per pre-operative assessment

## 2012-05-16 NOTE — Transfer of Care (Signed)
Immediate Anesthesia Transfer of Care Note  Patient: [name]  Procedure(s) Performed: Procedure(s) (LRB) with comments: HERNIA REPAIR INGUINAL ADULT (Left) - inguinal area INSERTION OF MESH (Left) - inguinal area  Patient Location: PACU  Anesthesia Type: GA combined with regional for post-op pain  Level of Consciousness: awake, alert  and oriented  Airway & Oxygen Therapy: Patient Spontanous Breathing and Patient connected to face mask oxygen  Post-op Assessment: Report given to PACU RN, Post -op Vital signs reviewed and stable and Patient moving all extremities  Post vital signs: Reviewed and stable  Complications: No apparent anesthesia complications

## 2012-05-16 NOTE — Anesthesia Postprocedure Evaluation (Signed)
  Anesthesia Post-op Note  Patient: [name]  Procedure(s) Performed: Procedure(s) (LRB) with comments: HERNIA REPAIR INGUINAL ADULT (Left) - inguinal area INSERTION OF MESH (Left) - inguinal area  Patient Location: PACU  Anesthesia Type: GA combined with regional for post-op pain  Level of Consciousness: awake, alert , oriented and patient cooperative  Airway and Oxygen Therapy: Patient Spontanous Breathing  Post-op Pain: mild  Post-op Assessment: Post-op Vital signs reviewed, Patient's Cardiovascular Status Stable, Respiratory Function Stable, Patent Airway, No signs of Nausea or vomiting and Pain level controlled  Post-op Vital Signs: Reviewed and stable  Complications: No apparent anesthesia complications

## 2012-05-16 NOTE — Interval H&P Note (Signed)
History and Physical Interval Note:  No change in H and P  05/16/2012 1:17 PM  [name]  has presented today for surgery, with the diagnosis of Left inguinal hernia  The various methods of treatment have been discussed with the patient and family. After consideration of risks, benefits and other options for treatment, the patient has consented to  Procedure(s) (LRB) with comments: HERNIA REPAIR INGUINAL ADULT (Left) INSERTION OF MESH (Left) as a surgical intervention .  The patient's history has been reviewed, patient examined, no change in status, stable for surgery.  I have reviewed the patient's chart and labs.  Questions were answered to the patient's satisfaction.     [name] A

## 2012-05-16 NOTE — H&P (View-Only) (Signed)
Patient ID: [name], male   DOB: 03/10/1971, 41 y.o.   MRN: [id number]  Chief Complaint  Patient presents with  . Pre-op Exam    Eval L/H    HPI [name] is a 41 y.o. male.   HPIThis is a pleasant gentleman referred by Dr. Stephanie Alm for evaluation of a inguinal hernia. The patient has noticed a bulge in his left groin several weeks ago. He was in the shower when he noticed it. He reports that it always reduces easily. He has very mild discomfort in the left groin. He has no previous surgical history regarding hernias. He has no complaints regarding his umbilicus or right groin  Past Medical History  Diagnosis Date  . Hyperlipidemia   . GERD (gastroesophageal reflux disease)   . Asthma   . Nephrolithiasis   . Lumbago   . Allergy   . Abnormal involuntary movements     History reviewed. No pertinent past surgical history.  History reviewed. No pertinent family history.  Social History History  Substance Use Topics  . Smoking status: Never Smoker   . Smokeless tobacco: Not on file  . Alcohol Use: Yes    No Known Allergies  Current Outpatient Prescriptions  Medication Sig Dispense Refill  . fish oil-omega-3 fatty acids 1000 MG capsule Take 2 g by mouth daily.      . ibuprofen (ADVIL,MOTRIN) 200 MG tablet Take 200 mg by mouth every 6 (six) hours as needed.      . Multiple Vitamins-Minerals (MULTIVITAMIN WITH MINERALS) tablet Take 1 tablet by mouth daily.      . ondansetron (ZOFRAN) 4 MG tablet         Review of Systems Review of Systems  Constitutional: Negative for fever, chills and unexpected weight change.  HENT: Negative for hearing loss, congestion, sore throat, trouble swallowing and voice change.   Eyes: Negative for visual disturbance.  Respiratory: Negative for cough and wheezing.   Cardiovascular: Negative for chest pain, palpitations and leg swelling.  Gastrointestinal: Positive for abdominal pain, constipation and abdominal distention. Negative  for nausea, vomiting, diarrhea, blood in stool, anal bleeding and rectal pain.  Genitourinary: Negative for hematuria and difficulty urinating.  Musculoskeletal: Negative for arthralgias.  Skin: Negative for rash and wound.  Neurological: Negative for seizures, syncope, weakness and headaches.  Hematological: Negative for adenopathy. Does not bruise/bleed easily.  Psychiatric/Behavioral: Negative for confusion.    Blood pressure 142/88, pulse 68, temperature 98 F (36.7 C), temperature source Temporal, resp. rate 20, height 5' 10" (1.778 m), weight 199 lb (90.266 kg), SpO2 98.00%.  Physical Exam Physical Exam  Constitutional: He is oriented to person, place, and time. He appears well-developed and well-nourished. No distress.  HENT:  Head: Normocephalic and atraumatic.  Right Ear: External ear normal.  Left Ear: External ear normal.  Nose: Nose normal.  Mouth/Throat: Oropharynx is clear and moist. No oropharyngeal exudate.  Eyes: Conjunctivae are normal. Pupils are equal, round, and reactive to light. Right eye exhibits no discharge. Left eye exhibits no discharge. No scleral icterus.  Neck: Normal range of motion. Neck supple. No tracheal deviation present.  Cardiovascular: Normal rate, regular rhythm, normal heart sounds and intact distal pulses.   No murmur heard. Pulmonary/Chest: Effort normal and breath sounds normal. No respiratory distress. He has no wheezes. He has no rales.  Abdominal: Bowel sounds are normal. He exhibits no distension. There is no tenderness. There is no guarding.       There is an easily reducible   left inguinal hernia. There is no evidence of right single hernia or umbilical hernia  Musculoskeletal: Normal range of motion. He exhibits no edema and no tenderness.  Lymphadenopathy:    He has no cervical adenopathy.  Neurological: He is alert and oriented to person, place, and time.  Skin: Skin is warm and dry. No rash noted. He is not diaphoretic. No  erythema.  Psychiatric: His behavior is normal. Judgment normal.    Data Reviewed I have the notes from her primary care physician which I have reviewed  Assessment     Left inguinal hernia  Plan    I discussed the diagnosis of hernia with the patient and his wife. I discussed with the laparoscopic repairs with mesh. I discussed the risk of surgery which includes but is not limited to bleeding, infection, nerve entrapment, chronic pain, recurrence, etc. He understands and wishes to proceed with open repair with mesh. Left inguinal hernia repair with mesh will thus be scheduled. Likelihood of success is good       Shelle Galdamez A 04/30/2012, 3:25 PM    

## 2012-05-16 NOTE — Op Note (Signed)
HERNIA REPAIR INGUINAL ADULT, INSERTION OF MESH  Procedure Note  [name] 05/16/2012   Pre-op Diagnosis: Left inguinal hernia     Post-op Diagnosis: same  Procedure(s): LEFT INGUINAL HERNIA REPAIR WITH MESH  Surgeon(s): Shelly Rubenstein, MD  Anesthesia: General  Staff:  Circulator Scrub Person Michelle Nasuti, RN - Circulator Idell Pickles, CST - Scrub Person  Estimated Blood Loss: Minimal               Specimens: hernia sac and nodules         Procedure: The patient was brought to the operating room and identified as the correct patient. He was placed supine on the operating room table and general anesthesia was induced. His left lower quadrant and groin were prepped and draped in the usual sterile fashion. I anesthetized the skin with Marcaine. I made a longitudinal incision with the scalpel. I took this down through Scarpa's fascia with the electrocautery. The external oblique fascia was identified and opened with internal and external rings. The testicular cord and structures was controlled a Penrose drain. The hernia sac as well as 2 nodules along the cord were separated from the cord structures. All contents were reduced back into the abdominal cavity. I tied off the base of the sac with a 2-0 silk suture. I then excised the sac and the nodules with the cautery and sent to pathology for evaluation. I then brought a piece of Prolene pariatex progrip mesh onto the field. I placed around the cord structures and fixated it to pubic tubercle with a Vicryl suture. It fit well around the cord structures and secured itself to the inguinal floor. Good coverage of the inguinal floor appeared to be achieved. I then closed the external oblique fascia over the top of this with a running 2-0 Vicryl suture. I anesthetized the skin further with Marcaine. I then closed Scarpa's fascia with interrupted 3-0 Vicryl sutures and closed the skin with a running 4-0 Monocryl. Steri-Strips,  gauze, and Tegaderm were applied. The patient tolerated the procedure well. All the counts were correct at the end of the procedure. The patient was then extubated in the operating room and taken in a stable condition to the recovery room. [name] A   Date: 05/16/2012  Time: 3:19 PM

## 2012-05-16 NOTE — Anesthesia Preprocedure Evaluation (Addendum)
Anesthesia Evaluation  Patient identified by MRN, date of birth, ID band Patient awake    Reviewed: Allergy & Precautions, H&P , NPO status , Patient's Chart, lab work & pertinent test results  History of Anesthesia Complications Negative for: history of anesthetic complications  Airway Mallampati: I TM Distance: >3 FB Neck ROM: Full    Dental  (+) Teeth Intact and Dental Advisory Given   Pulmonary neg pulmonary ROS,  breath sounds clear to auscultation  Pulmonary exam normal       Cardiovascular negative cardio ROS  Rhythm:Regular Rate:Normal     Neuro/Psych negative neurological ROS     GI/Hepatic Neg liver ROS, GERD-  Controlled,  Endo/Other  negative endocrine ROS  Renal/GU Renal diseasenegative Renal ROS     Musculoskeletal   Abdominal   Peds  Hematology   Anesthesia Other Findings   Reproductive/Obstetrics                           Anesthesia Physical Anesthesia Plan  ASA: I  Anesthesia Plan: General   Post-op Pain Management:    Induction: Intravenous  Airway Management Planned: LMA  Additional Equipment:   Intra-op Plan:   Post-operative Plan:   Informed Consent: I have reviewed the patients History and Physical, chart, labs and discussed the procedure including the risks, benefits and alternatives for the proposed anesthesia with the patient or authorized representative who has indicated his/her understanding and acceptance.   Dental advisory given  Plan Discussed with: CRNA and Surgeon  Anesthesia Plan Comments: (Plan routine monitors, GA- LMA OK, TAP block for post op analgesia  )        Anesthesia Quick Evaluation

## 2012-05-16 NOTE — Progress Notes (Signed)
Assisted Dr. Jean Rosenthal with left, ultrasound guided, transabdominal plane block. Side rails up, monitors on throughout procedure. See vital signs in flow sheet. Tolerated Procedure well.

## 2012-05-17 ENCOUNTER — Encounter (HOSPITAL_BASED_OUTPATIENT_CLINIC_OR_DEPARTMENT_OTHER): Payer: Self-pay | Admitting: Surgery

## 2012-05-28 ENCOUNTER — Encounter (INDEPENDENT_AMBULATORY_CARE_PROVIDER_SITE_OTHER): Payer: Self-pay | Admitting: Surgery

## 2012-05-28 ENCOUNTER — Ambulatory Visit (INDEPENDENT_AMBULATORY_CARE_PROVIDER_SITE_OTHER): Payer: 59 | Admitting: Surgery

## 2012-05-28 VITALS — BP 110/82 | HR 72 | Temp 98.7°F | Resp 18 | Ht 71.0 in | Wt 206.0 lb

## 2012-05-28 DIAGNOSIS — Z09 Encounter for follow-up examination after completed treatment for conditions other than malignant neoplasm: Secondary | ICD-10-CM

## 2012-05-28 NOTE — Progress Notes (Signed)
Subjective:     Patient ID: [name], male   DOB: [birth year], 41 y.o.   MRN: [id number]  HPI He is here for his first postop visit status post left inguinal hernia repair with mesh. He is doing well and has no complaints.  Review of Systems     Objective:   Physical Exam On exam, there is minimal swelling in the incision is healing well. There is no evidence of recurrence.    Assessment:     Patient status post left inguinal hernia repair mesh    Plan:     He will return to work on October 7 to full activity.  I will see him back as needed

## 2019-11-15 ENCOUNTER — Ambulatory Visit: Payer: Self-pay | Attending: Internal Medicine

## 2019-11-15 DIAGNOSIS — Z23 Encounter for immunization: Secondary | ICD-10-CM

## 2019-11-15 NOTE — Progress Notes (Signed)
   Covid-19 Vaccination Clinic  Name:  [name]    MRN: [id number] DOB: [birth year]  11/15/2019  Mr. [name] was observed post Covid-19 immunization for 15 minutes without incident. He was provided with Vaccine Information Sheet and instruction to access the V-Safe system.   Mr. [name] was instructed to call 911 with any severe reactions post vaccine: Marland Kitchen Difficulty breathing  . Swelling of face and throat  . A fast heartbeat  . A bad rash all over body  . Dizziness and weakness   Immunizations Administered    Name Date Dose VIS Date Route   Pfizer COVID-19 Vaccine 11/15/2019  3:55 PM 0.3 mL 08/16/2019 Intramuscular   Manufacturer: [employer], [employer]   Lot: [id number]   NDC: [address]-3

## 2019-12-09 ENCOUNTER — Ambulatory Visit: Payer: Self-pay | Attending: Internal Medicine

## 2019-12-09 DIAGNOSIS — Z23 Encounter for immunization: Secondary | ICD-10-CM

## 2019-12-09 NOTE — Progress Notes (Signed)
   Covid-19 Vaccination Clinic  Name:  EH SESAY    MRN: [id number] DOB: July 12, 1971  12/09/2019  Mr. Beavers was observed post Covid-19 immunization for 15 minutes without incident. He was provided with Vaccine Information Sheet and instruction to access the V-Safe system.   Mr. Pollok was instructed to call 911 with any severe reactions post vaccine: Marland Kitchen Difficulty breathing  . Swelling of face and throat  . A fast heartbeat  . A bad rash all over body  . Dizziness and weakness   Immunizations Administered    Name Date Dose VIS Date Route   Pfizer COVID-19 Vaccine 12/09/2019  5:13 PM 0.3 mL 08/16/2019 Intramuscular   Manufacturer: [employer], [employer]   Lot: [id number]   NDC: [address]-9

## 2021-01-30 DIAGNOSIS — U071 COVID-19: Secondary | ICD-10-CM | POA: Diagnosis not present

## 2021-03-19 DIAGNOSIS — Z125 Encounter for screening for malignant neoplasm of prostate: Secondary | ICD-10-CM | POA: Diagnosis not present

## 2021-03-19 DIAGNOSIS — Z0000 Encounter for general adult medical examination without abnormal findings: Secondary | ICD-10-CM | POA: Diagnosis not present

## 2021-03-19 DIAGNOSIS — Z13220 Encounter for screening for lipoid disorders: Secondary | ICD-10-CM | POA: Diagnosis not present

## 2021-03-19 DIAGNOSIS — Z131 Encounter for screening for diabetes mellitus: Secondary | ICD-10-CM | POA: Diagnosis not present

## 2021-03-19 DIAGNOSIS — Z6830 Body mass index (BMI) 30.0-30.9, adult: Secondary | ICD-10-CM | POA: Diagnosis not present

## 2021-07-07 DIAGNOSIS — Z23 Encounter for immunization: Secondary | ICD-10-CM | POA: Diagnosis not present

## 2021-07-07 DIAGNOSIS — M519 Unspecified thoracic, thoracolumbar and lumbosacral intervertebral disc disorder: Secondary | ICD-10-CM | POA: Diagnosis not present

## 2021-07-07 DIAGNOSIS — M5432 Sciatica, left side: Secondary | ICD-10-CM | POA: Diagnosis not present

## 2021-11-06 ENCOUNTER — Other Ambulatory Visit: Payer: Self-pay

## 2021-11-06 ENCOUNTER — Emergency Department (HOSPITAL_COMMUNITY)
Admission: EM | Admit: 2021-11-06 | Discharge: 2021-11-06 | Disposition: A | Discharge status: Home / Self Care | Payer: BC Managed Care – PPO | Attending: Emergency Medicine | Admitting: Emergency Medicine

## 2021-11-06 ENCOUNTER — Encounter (HOSPITAL_COMMUNITY): Payer: Self-pay | Admitting: Emergency Medicine

## 2021-11-06 ENCOUNTER — Emergency Department (HOSPITAL_COMMUNITY): Payer: BC Managed Care – PPO

## 2021-11-06 DIAGNOSIS — I4891 Unspecified atrial fibrillation: Secondary | ICD-10-CM | POA: Diagnosis not present

## 2021-11-06 DIAGNOSIS — R002 Palpitations: Secondary | ICD-10-CM | POA: Diagnosis not present

## 2021-11-06 DIAGNOSIS — E860 Dehydration: Secondary | ICD-10-CM | POA: Diagnosis not present

## 2021-11-06 DIAGNOSIS — I499 Cardiac arrhythmia, unspecified: Secondary | ICD-10-CM | POA: Diagnosis not present

## 2021-11-06 DIAGNOSIS — R000 Tachycardia, unspecified: Secondary | ICD-10-CM | POA: Diagnosis not present

## 2021-11-06 LAB — COMPREHENSIVE METABOLIC PANEL
ALT: 25 U/L (ref 0–44)
AST: 34 U/L (ref 15–41)
Albumin: 4 g/dL (ref 3.5–5.0)
Alkaline Phosphatase: 58 U/L (ref 38–126)
Anion gap: 11 (ref 5–15)
BUN: 17 mg/dL (ref 6–20)
CO2: 20 mmol/L — ABNORMAL LOW (ref 22–32)
Calcium: 8.8 mg/dL — ABNORMAL LOW (ref 8.9–10.3)
Chloride: 107 mmol/L (ref 98–111)
Creatinine, Ser: 0.93 mg/dL (ref 0.61–1.24)
GFR, Estimated: >60 mL/min (ref >60)
Glucose, Bld: 103 mg/dL — ABNORMAL HIGH (ref 70–99)
Potassium: 4.1 mmol/L (ref 3.5–5.1)
Sodium: 138 mmol/L (ref 135–145)
Total Bilirubin: 0.9 mg/dL (ref 0.3–1.2)
Total Protein: 6.7 g/dL (ref 6.5–8.1)

## 2021-11-06 LAB — CBC WITH DIFFERENTIAL/PLATELET
Abs Immature Granulocytes: 0.03 10*3/uL (ref 0.00–0.07)
Basophils Absolute: 0.1 10*3/uL (ref 0.0–0.1)
Basophils Relative: 1 %
Eosinophils Absolute: 0.2 10*3/uL (ref 0.0–0.5)
Eosinophils Relative: 2 %
HCT: 46.4 % (ref 39.0–52.0)
Hemoglobin: 15.7 g/dL (ref 13.0–17.0)
Immature Granulocytes: 0 %
Lymphocytes Relative: 32 %
Lymphs Abs: 2.9 10*3/uL (ref 0.7–4.0)
MCH: 30.1 pg (ref 26.0–34.0)
MCHC: 33.8 g/dL (ref 30.0–36.0)
MCV: 89.1 fL (ref 80.0–100.0)
Monocytes Absolute: 0.9 10*3/uL (ref 0.1–1.0)
Monocytes Relative: 10 %
Neutro Abs: 5 10*3/uL (ref 1.7–7.7)
Neutrophils Relative %: 55 %
Platelets: 324 10*3/uL (ref 150–400)
RBC: 5.21 MIL/uL (ref 4.22–5.81)
RDW: 13 % (ref 11.5–15.5)
WBC: 9.1 10*3/uL (ref 4.0–10.5)
nRBC: 0 % (ref 0.0–0.2)

## 2021-11-06 LAB — TROPONIN I (HIGH SENSITIVITY): Troponin I (High Sensitivity): 6 ng/L (ref <18)

## 2021-11-06 LAB — MAGNESIUM: Magnesium: 2.1 mg/dL (ref 1.7–2.4)

## 2021-11-06 MED ORDER — METOPROLOL TARTRATE 25 MG PO TABS: 25.0000 mg | ORAL_TABLET | Freq: Two times a day (BID) | ORAL | 0 refills | Status: DC | PRN

## 2021-11-06 MED ORDER — LACTATED RINGERS IV BOLUS
1000.0000 mL | Freq: Once | INTRAVENOUS | Status: AC
  Administered 2021-11-06: 1000 mL via INTRAVENOUS

## 2021-11-06 MED ORDER — DILTIAZEM HCL 30 MG PO TABS: 30.0000 mg | ORAL_TABLET | Freq: Three times a day (TID) | ORAL | 0 refills | Status: DC | PRN

## 2021-11-06 MED ORDER — DILTIAZEM HCL ER COATED BEADS 120 MG PO CP24
120.0000 mg | ORAL_CAPSULE | Freq: Once | ORAL | Status: DC
  Filled 2021-11-06: qty 1

## 2021-11-06 NOTE — ED Provider Notes (Signed)
? ?[facility] EMERGENCY DEPARTMENT  ?Provider Note ? ?CSN: [id number] ?Arrival date & time: 11/06/21 2044 ? ?History ?Chief Complaint  ?Patient presents with  ? Atrial Fibrillation  ? ? ?[name] is a 51 y.o. male who presents today with tachycardia.  Patient had sudden onset of tachycardia and palpitations at home.  Found his heart rate to be elevated.  It was not improving on its own.  EMS was called.  EMS found patient to be in atrial fibrillation.  They tried adenosine multiple times, as well as diltiazem.  Diltiazem was slowed the heart rate down to less than 100.  Patient has not had any chest pain.  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  No recent travel.  No leg pain or leg swelling.  No chest pain or shortness of breath. ? ? ?Home Medications ?Prior to Admission medications   ?Medication Sig Start Date End Date Taking? Authorizing Provider  ?metoprolol tartrate (LOPRESSOR) 25 MG tablet Take 1 tablet (25 mg total) by mouth 2 (two) times daily as needed for up to 10 days. 11/06/21 11/16/21 Yes Edison Simon, MD  ?fish oil-omega-3 fatty acids 1000 MG capsule Take 2 g by mouth daily.    [provider]  ?ibuprofen (ADVIL,MOTRIN) 200 MG tablet Take 200 mg by mouth every 6 (six) hours as needed.    [provider]  ?Multiple Vitamins-Minerals (MULTIVITAMIN WITH MINERALS) tablet Take 1 tablet by mouth daily.    [provider]  ?ondansetron (ZOFRAN) 4 MG tablet  04/23/12   [provider]  ? ? ? ?Allergies    ?Patient has no known allergies. ? ? ?Review of Systems   ?Review of Systems  ?Constitutional:  Negative for chills and fever.  ?HENT:  Negative for ear pain and sore throat.   ?Eyes:  Negative for pain and visual disturbance.  ?Respiratory:  Negative for cough and shortness of breath.   ?Cardiovascular:  Positive for palpitations. Negative for chest pain.  ?Gastrointestinal:  Negative for abdominal pain and  vomiting.  ?Genitourinary:  Negative for dysuria and hematuria.  ?Musculoskeletal:  Negative for arthralgias and back pain.  ?Skin:  Negative for color change and rash.  ?Neurological:  Negative for seizures and syncope.  ?All other systems reviewed and are negative. ?Please see HPI for pertinent positives and negatives ? ?Physical Exam ?BP 113/78   Pulse 76   Temp 98.2 ?F (36.8 ?C) (Oral)   Resp 19   SpO2 94%  ? ?Physical Exam ?Vitals and nursing note reviewed.  ?Constitutional:   ?   General: He is not in acute distress. ?   Appearance: He is well-developed.  ?HENT:  ?   Head: Normocephalic and atraumatic.  ?Eyes:  ?   Conjunctiva/sclera: Conjunctivae normal.  ?Cardiovascular:  ?   Rate and Rhythm: Normal rate. Rhythm irregular.  ?   Heart sounds: No murmur heard. ?Pulmonary:  ?   Effort: Pulmonary effort is normal. No respiratory distress.  ?   Breath sounds: Normal breath sounds.  ?Abdominal:  ?   Palpations: Abdomen is soft.  ?   Tenderness: There is no abdominal tenderness.  ?Musculoskeletal:     ?   General: No swelling.  ?   Cervical back: Neck supple.  ?Skin: ?   General: Skin is warm and dry.  ?   Capillary Refill: Capillary refill takes less than 2 seconds.  ?Neurological:  ?   Mental Status: He  is alert.  ?Psychiatric:     ?   Mood and Affect: Mood normal.  ? ? ?ED Results / Procedures / Treatments   ?EKG ?EKG Interpretation ? ?Date/Time:  Saturday November 06 2021 21:48:21 EST ?Ventricular Rate:  81 ?PR Interval:  161 ?QRS Duration: 107 ?QT Interval:  361 ?QTC Calculation: 419 ?R Axis:   84 ?Text Interpretation: Sinus rhythm previous tracing showed atrial fibrillation earlier in the day Confirmed by Richardean Canal [phone]) on 11/06/2021 9:50:13 PM ? ?Procedures ?Procedures ? ?Medications Ordered in the ED ?Medications  ?diltiazem (CARDIZEM CD) 24 hr capsule 120 mg (0 mg Oral Hold 11/06/21 2150)  ?lactated ringers bolus 1,000 mL (0 mLs Intravenous Stopped 11/06/21 2245)  ? ? ? ?ED Course  ? ?  ? ? ?MDM  ? ?This  patient presents to the ED for concern of AFIB, this involves an extensive number of treatment options, and is a complaint that carries with it a high risk of complications and morbidity.  The differential diagnosis includes paroxysmal AFIB, ACS, dehydration. ? ?Additional history obtained: ?Additional history obtained from family and EMS  ?Records reviewed Care Everywhere/External Records and Primary Care Documents ? ?Lab Tests: ?I Ordered, and personally interpreted labs.  The pertinent results include: CBC and CMP were unremarkable.  Troponin was normal. ? ?Imaging Studies ordered: ?I ordered imaging studies including X-ray chest   ?I independently visualized and interpreted imaging which showed acute abnormalities. ?I agree with the radiologist interpretation ? ?EKG (personally reviewed and interpreted): Initial EKG showed atrial fibrillation.  No signs of ST elevation or ischemia. ? ? ?Medical Decision Making: Patient presented with new onset atrial fibrillation.  He was in A-fib with RVR for EMS.  Adenosine was unsuccessful in converting him.  He was given diltiazem, 20 mg IV.  He had his rate slow down to between 80-100.  On arrival he was still in A-fib.  He received just under a liter of fluid with EMS.  He received another liter of fluid here in the emergency room.  He spontaneously converted back into a normal sinus rhythm while receiving fluids.  He never received any further Cardizem here in the emergency department.  At no time did he have any chest pain.  Low suspicion for PE.  Etiology of his new onset A-fib unclear, possibly related to dehydration.  Appears to be paroxysmal as he remained in normal sinus rhythm here after converting spontaneously.  Work-up was unremarkable.  We will plan for discharge home with cardiology follow-up.  Patient sent with a small amount of metoprolol for rate management if he were to flip back into atrial fibrillation.  Patient and family are comfortable with this plan.   Patient safe for discharge home at this time. ? ?Complexity of problems addressed: ?Patient?s presentation is most consistent with  acute presentation with potential threat to life or bodily function ? ?Disposition: ?After consideration of the diagnostic results and the patient?s response to treatment,  ?I feel that the patent would benefit from discharge home .  ? ?Patient seen in conjunction with my attending, Dr. Silverio Lay. ? ? ? ?Final Clinical Impression(s) / ED Diagnoses ?Final diagnoses:  ?Atrial fibrillation with RVR (HCC)  ? ? ?Rx / DC Orders ?ED Discharge Orders   ? ?      Ordered  ?  diltiazem (CARDIZEM) 30 MG tablet  3 times daily PRN,   Status:  Discontinued       ? 11/06/21 2218  ?  metoprolol tartrate (LOPRESSOR)  25 MG tablet  2 times daily PRN       ? 11/06/21 2230  ? ?  ?  ? ?  ? ? ?  ?Edison Simon, MD ?11/06/21 2256 ? ?  ?Charlynne Pander, MD ?11/07/21 1716 ? ?

## 2021-11-06 NOTE — ED Triage Notes (Signed)
Pt BIB [facility] from home, reported feeling his heart beating fast and irregular. Given total of 18mg  adenosine from EMS with little change, rate improved after 20mg  cardizem, HR as high as 190, improved to 80-100 after cardizem. No hx afib. ?

## 2021-11-06 NOTE — Discharge Instructions (Addendum)
Prescription was written for Metoprolol 25mg  tablets.  Please take if heart is greater than 110 and patient is having symptoms.  If heart rate does not improve patient continues to be symptomatic, please return to the emergency department. ?

## 2021-11-09 ENCOUNTER — Ambulatory Visit: Payer: BC Managed Care – PPO | Admitting: Cardiology

## 2021-11-09 ENCOUNTER — Ambulatory Visit (INDEPENDENT_AMBULATORY_CARE_PROVIDER_SITE_OTHER): Payer: BC Managed Care – PPO

## 2021-11-09 ENCOUNTER — Other Ambulatory Visit: Payer: Self-pay

## 2021-11-09 ENCOUNTER — Encounter: Payer: Self-pay | Admitting: Cardiology

## 2021-11-09 VITALS — BP 128/100 | HR 75 | Ht 71.0 in | Wt 225.0 lb

## 2021-11-09 DIAGNOSIS — I480 Paroxysmal atrial fibrillation: Secondary | ICD-10-CM

## 2021-11-09 DIAGNOSIS — R0683 Snoring: Secondary | ICD-10-CM

## 2021-11-09 DIAGNOSIS — I10 Essential (primary) hypertension: Secondary | ICD-10-CM

## 2021-11-09 MED ORDER — DILTIAZEM HCL ER COATED BEADS 120 MG PO CP24: 120.0000 mg | ORAL_CAPSULE | Freq: Every day | ORAL | 3 refills | Status: DC

## 2021-11-09 NOTE — Progress Notes (Unsigned)
Enrolled patient for a 14 day Zio XT  monitor to be mailed to patients home  °

## 2021-11-09 NOTE — Progress Notes (Signed)
Cardiology Office Note:    Date:  11/12/2021   ID:  [name], DOB November 06, 1970, MRN [id number]  PCP:  Vivien Presto, MD  Cardiologist:  Thomasene Ripple, DO  Electrophysiologist:  None   Referring MD: Vivien Presto, MD   "I am fine- I was recently diagnosed with atrial fibrillation"  History of Present Illness:    [name] is a 51 y.o. male with a hx of asthma, GERD, hyperlipidemia initially presented to the [facility] emergency department on November 06, 2021 after he was experiencing significant tachycardia.  He notes that this started abruptly.  And when the heart rate kept going up without any resolution they called EMS.  At the time of EMS presentation the patient was found to be in atrial fibrillation.  He was given adenosine.  Then he was given adenosine and transported to the emergency department.  He was kept on adenosine once his heart rate slowed down he converted spontaneously.  His lab work was ordered and they were within normal limits.  He was given Lopressor as needed and recommended to see cardiology.  He is here today with his wife.  He has not had any recurrent significant palpitations.  He does admit to shortness of breath as well as some fatigue and snoring.  He believes this is his first time he has had symptoms like this.  Because once he felt his heart out of rhythm he got significant symptomatic with shortness of breath.  No other complaints at this time.  Past Medical History:  Diagnosis Date   Abnormal involuntary movements(781.0)    Allergy    Asthma    had hx bronchitis-cough-better now   GERD (gastroesophageal reflux disease)    no meds now-cough gone   Hyperlipidemia    Lumbago    Nephrolithiasis     Past Surgical History:  Procedure Laterality Date   BACK SURGERY  2006   lumb lam   HERNIA REPAIR  05/16/2012   lih repair   INGUINAL HERNIA REPAIR  05/16/2012   Procedure: HERNIA REPAIR INGUINAL ADULT;  Surgeon: Shelly Rubenstein, MD;  Location: [facility];  Service: General;  Laterality: Left;  inguinal area   TONSILLECTOMY     WISDOM TOOTH EXTRACTION  2005    Current Medications: Current Meds  Medication Sig   diltiazem (CARDIZEM CD) 120 MG 24 hr capsule Take 1 capsule (120 mg total) by mouth daily.   famotidine (PEPCID) 20 MG tablet    ibuprofen (ADVIL,MOTRIN) 200 MG tablet Take 200 mg by mouth every 6 (six) hours as needed.   metoprolol tartrate (LOPRESSOR) 25 MG tablet Take 1 tablet (25 mg total) by mouth 2 (two) times daily as needed for up to 10 days.   Multiple Vitamins-Minerals (MULTIVITAMIN WITH MINERALS) tablet Take 1 tablet by mouth daily.     Allergies:   Patient has no known allergies.   Social History   Socioeconomic History   Marital status: Married    Spouse name: Not on file   Number of children: Not on file   Years of education: Not on file   Highest education level: Not on file  Occupational History   Not on file  Tobacco Use   Smoking status: Never   Smokeless tobacco: Not on file  Substance and Sexual Activity   Alcohol use: Yes   Drug use: No   Sexual activity: Not on file  Other Topics Concern   Not on  file  Social History Narrative   Not on file   Social Determinants of Health   Financial Resource Strain: Not on file  Food Insecurity: Not on file  Transportation Needs: Not on file  Physical Activity: Not on file  Stress: Not on file  Social Connections: Not on file     Family History: The patient's family history is not on file.  ROS:   Review of Systems  Constitution: Negative for decreased appetite, fever and weight gain.  HENT: Negative for congestion, ear discharge, hoarse voice and sore throat.   Eyes: Negative for discharge, redness, vision loss in right eye and visual halos.  Cardiovascular: Negative for chest pain, dyspnea on exertion, leg swelling, orthopnea and palpitations.  Respiratory: Negative for cough, hemoptysis,  shortness of breath and snoring.   Endocrine: Negative for heat intolerance and polyphagia.  Hematologic/Lymphatic: Negative for bleeding problem. Does not bruise/bleed easily.  Skin: Negative for flushing, nail changes, rash and suspicious lesions.  Musculoskeletal: Negative for arthritis, joint pain, muscle cramps, myalgias, neck pain and stiffness.  Gastrointestinal: Negative for abdominal pain, bowel incontinence, diarrhea and excessive appetite.  Genitourinary: Negative for decreased libido, genital sores and incomplete emptying.  Neurological: Negative for brief paralysis, focal weakness, headaches and loss of balance.  Psychiatric/Behavioral: Negative for altered mental status, depression and suicidal ideas.  Allergic/Immunologic: Negative for HIV exposure and persistent infections.    EKGs/Labs/Other Studies Reviewed:    The following studies were reviewed today:   EKG:  The ekg ordered today demonstrates sinus rhythm, heart rate 75 beats minute.  Recent Labs: 11/06/2021: ALT 25; BUN 17; Creatinine, Ser 0.93; Hemoglobin 15.7; Magnesium 2.1; Platelets 324; Potassium 4.1; Sodium 138  Recent Lipid Panel No results found for: CHOL, TRIG, HDL, CHOLHDL, VLDL, LDLCALC, LDLDIRECT  Physical Exam:    VS:  BP (!) 128/100   Pulse 75   Ht 5\' 11"  (1.803 m)   Wt 225 lb (102.1 kg)   SpO2 99%   BMI 31.38 kg/m     Wt Readings from Last 3 Encounters:  11/09/21 225 lb (102.1 kg)  05/28/12 206 lb (93.4 kg)  05/16/12 200 lb (90.7 kg)     GEN: Well nourished, well developed in no acute distress HEENT: Normal NECK: No JVD; No carotid bruits LYMPHATICS: No lymphadenopathy CARDIAC: S1S2 noted,RRR, no murmurs, rubs, gallops RESPIRATORY:  Clear to auscultation without rales, wheezing or rhonchi  ABDOMEN: Soft, non-tender, non-distended, +bowel sounds, no guarding. EXTREMITIES: No edema, No cyanosis, no clubbing MUSCULOSKELETAL:  No deformity  SKIN: Warm and dry NEUROLOGIC:  Alert and  oriented x 3, non-focal PSYCHIATRIC:  Normal affect, good insight  ASSESSMENT:    1. PAF (paroxysmal atrial fibrillation) (HCC)   2. Hypertension, unspecified type   3. Morbid obesity (HCC)   4. Snoring    PLAN:    She is in sinus rhythm today.  I was able to educate the patient and his wife about the details of the diagnosis of atrial fibrillation.  We talked about the different strategies for treatment of atrial fibrillation rate control, rhythm control as well as stroke prevention.  He does not require anticoagulation at this time chads Vascor may be likely 1 suspected hypertension but this was an isolated high reading in the office today.  He is in sinus rhythm so we are going to start the patient on low-dose Cardizem which hopefully will help with his blood pressure as well.  He snores and there is some fatigue we will get sleep apnea  test-get a sleep study.  As we also need to understand if this is contributing to his new onset atrial fibrillation.  An echocardiogram will be done to assess for any structural abnormalities.  I will place a ZIO monitor and the patient to understand if he is experiencing any asymptomatic episodes of atrial fibrillation.  The patient is in agreement with the above plan. The patient left the office in stable condition.  The patient will follow up in 12 weeks   Medication Adjustments/Labs and Tests Ordered: Current medicines are reviewed at length with the patient today.  Concerns regarding medicines are outlined above.  Orders Placed This Encounter  Procedures   LONG TERM MONITOR (3-14 DAYS)   EKG 12-Lead   ECHOCARDIOGRAM COMPLETE   Split night study   Meds ordered this encounter  Medications   diltiazem (CARDIZEM CD) 120 MG 24 hr capsule    Sig: Take 1 capsule (120 mg total) by mouth daily.    Dispense:  90 capsule    Refill:  3    Patient Instructions  Medication Instructions:  Your physician has recommended you make the following change  in your medication:  START: Cardizem 120 mg at night *If you need a refill on your cardiac medications before your next appointment, please call your pharmacy*   Lab Work: None If you have labs (blood work) drawn today and your tests are completely normal, you will receive your results only by: MyChart Message (if you have MyChart) OR A paper copy in the mail If you have any lab test that is abnormal or we need to change your treatment, we will call you to review the results.   Testing/Procedures: Your physician has requested that you have an echocardiogram. Echocardiography is a painless test that uses sound waves to create images of your heart. It provides your doctor with information about the size and shape of your heart and how well your hearts chambers and valves are working. This procedure takes approximately one hour. There are no restrictions for this procedure.  Your physician has recommended that you have a sleep study. This test records several body functions during sleep, including: brain activity, eye movement, oxygen and carbon dioxide blood levels, heart rate and rhythm, breathing rate and rhythm, the flow of air through your mouth and nose, snoring, body muscle movements, and chest and belly movement.  ZIO XT- Long Term Monitor Instructions  Your physician has requested you wear a ZIO patch monitor for 14 days.  This is a single patch monitor. [employer] supplies one patch monitor per enrollment. Additional stickers are not available. Please do not apply patch if you will be having a Nuclear Stress Test,  Echocardiogram, Cardiac CT, MRI, or Chest Xray during the period you would be wearing the  monitor. The patch cannot be worn during these tests. You cannot remove and re-apply the  ZIO XT patch monitor.  Your ZIO patch monitor will be mailed 3 day USPS to your address on file. It may take 3-5 days  to receive your monitor after you have been enrolled.  Once you have  received your monitor, please review the enclosed instructions. Your monitor  has already been registered assigning a specific monitor serial # to you.  Billing and Patient Assistance Program Information  We have supplied [employer] with any of your insurance information on file for billing purposes. [employer] offers a sliding scale Patient Assistance Program for patients that do not have  insurance, or whose insurance does not  completely cover the cost of the ZIO monitor.  You must apply for the Patient Assistance Program to qualify for this discounted rate.  To apply, please call [employer] at [phone], select option 4, select option 2, ask to apply for  Patient Assistance Program. Meredeth Ide will ask your household income, and how many people  are in your household. They will quote your out-of-pocket cost based on that information.  [employer] will also be able to set up a 70-month, interest-free payment plan if needed.  Applying the monitor   Shave hair from upper left chest.  Hold abrader disc by orange tab. Rub abrader in 40 strokes over the upper left chest as  indicated in your monitor instructions.  Clean area with 4 enclosed alcohol pads. Let dry.  Apply patch as indicated in monitor instructions. Patch will be placed under collarbone on left  side of chest with arrow pointing upward.  Rub patch adhesive wings for 2 minutes. Remove white label marked "1". Remove the white  label marked "2". Rub patch adhesive wings for 2 additional minutes.  While looking in a mirror, press and release button in center of patch. A small green light will  flash 3-4 times. This will be your only indicator that the monitor has been turned on.  Do not shower for the first 24 hours. You may shower after the first 24 hours.  Press the button if you feel a symptom. You will hear a small click. Record Date, Time and  Symptom in the Patient Logbook.  When you are ready to remove the patch, follow instructions on  the last 2 pages of Patient  Logbook. Stick patch monitor onto the last page of Patient Logbook.  Place Patient Logbook in the blue and white box. Use locking tab on box and tape box closed  securely. The blue and white box has prepaid postage on it. Please place it in the mailbox as  soon as possible. Your physician should have your test results approximately 7 days after the  monitor has been mailed back to [facility].  Call [facility] Customer Care at [phone] if you have questions regarding  your ZIO XT patch monitor. Call them immediately if you see an orange light blinking on your  monitor.  If your monitor falls off in less than 4 days, contact our Monitor department at [phone].  If your monitor becomes loose or falls off after 4 days call [employer] at [phone] for  suggestions on securing your monitor    Follow-Up: At [facility], you and your health needs are our priority.  As part of our continuing mission to provide you with exceptional heart care, we have created designated Provider Care Teams.  These Care Teams include your primary Cardiologist (physician) and Advanced Practice Providers (APPs -  Physician Assistants and Nurse Practitioners) who all work together to provide you with the care you need, when you need it.  We recommend signing up for the patient portal called "MyChart".  Sign up information is provided on this After Visit Summary.  MyChart is used to connect with patients for Virtual Visits (Telemedicine).  Patients are able to view lab/test results, encounter notes, upcoming appointments, etc.  Non-urgent messages can be sent to your provider as well.   To learn more about what you can do with MyChart, go to [email or web address].    Your next appointment:   12 week(s)  The format for your next appointment:   In Person  Provider:   Lavona Mound  Clayton Jarmon, DO     Other Instructions     Adopting a Healthy Lifestyle.  Know what a healthy  weight is for you (roughly BMI <25) and aim to maintain this   Aim for 7+ servings of fruits and vegetables daily   65-80+ fluid ounces of water or unsweet tea for healthy kidneys   Limit to max 1 drink of alcohol per day; avoid smoking/tobacco   Limit animal fats in diet for cholesterol and heart health - choose grass fed whenever available   Avoid highly processed foods, and foods high in saturated/trans fats   Aim for low stress - take time to unwind and care for your mental health   Aim for 150 min of moderate intensity exercise weekly for heart health, and weights twice weekly for bone health   Aim for 7-9 hours of sleep daily   When it comes to diets, agreement about the perfect plan isnt easy to find, even among the experts. Experts at the [facility] of [employer] developed an idea known as the Healthy Eating Plate. Just imagine a plate divided into logical, healthy portions.   The emphasis is on diet quality:   Load up on vegetables and fruits - one-half of your plate: Aim for color and variety, and remember that potatoes dont count.   Go for whole grains - one-quarter of your plate: Whole wheat, barley, wheat berries, quinoa, oats, brown rice, and foods made with them. If you want pasta, go with whole wheat pasta.   Protein power - one-quarter of your plate: Fish, chicken, beans, and nuts are all healthy, versatile protein sources. Limit red meat.   The diet, however, does go beyond the plate, offering a few other suggestions.   Use healthy plant oils, such as olive, canola, soy, corn, sunflower and peanut. Check the labels, and avoid partially hydrogenated oil, which have unhealthy trans fats.   If youre thirsty, drink water. Coffee and tea are good in moderation, but skip sugary drinks and limit milk and dairy products to one or two daily servings.   The type of carbohydrate in the diet is more important than the amount. Some sources of carbohydrates, such as  vegetables, fruits, whole grains, and beans-are healthier than others.   Finally, stay active  Signed, Thomasene Ripple, DO  11/12/2021 8:33 AM    [facility]

## 2021-11-09 NOTE — Patient Instructions (Signed)
Medication Instructions:  ?Your physician has recommended you make the following change in your medication:  ?START: Cardizem 120 mg at night ?*If you need a refill on your cardiac medications before your next appointment, please call your pharmacy* ? ? ?Lab Work: ?None ?If you have labs (blood work) drawn today and your tests are completely normal, you will receive your results only by: ?MyChart Message (if you have MyChart) OR ?A paper copy in the mail ?If you have any lab test that is abnormal or we need to change your treatment, we will call you to review the results. ? ? ?Testing/Procedures: ?Your physician has requested that you have an echocardiogram. Echocardiography is a painless test that uses sound waves to create images of your heart. It provides your doctor with information about the size and shape of your heart and how well your heart?s chambers and valves are working. This procedure takes approximately one hour. There are no restrictions for this procedure. ? ?Your physician has recommended that you have a sleep study. This test records several body functions during sleep, including: brain activity, eye movement, oxygen and carbon dioxide blood levels, heart rate and rhythm, breathing rate and rhythm, the flow of air through your mouth and nose, snoring, body muscle movements, and chest and belly movement. ? ?ZIO XT- Long Term Monitor Instructions ? ?Your physician has requested you wear a ZIO patch monitor for 14 days.  ?This is a single patch monitor. [employer] supplies one patch monitor per enrollment. Additional ?stickers are not available. Please do not apply patch if you will be having a Nuclear Stress Test,  ?Echocardiogram, Cardiac CT, MRI, or Chest Xray during the period you would be wearing the  ?monitor. The patch cannot be worn during these tests. You cannot remove and re-apply the  ?ZIO XT patch monitor.  ?Your ZIO patch monitor will be mailed 3 day USPS to your address on file. It may take  3-5 days  ?to receive your monitor after you have been enrolled.  ?Once you have received your monitor, please review the enclosed instructions. Your monitor  ?has already been registered assigning a specific monitor serial # to you. ? ?Billing and Patient Assistance Program Information ? ?We have supplied [employer] with any of your insurance information on file for billing purposes. ?[employer] offers a sliding scale Patient Assistance Program for patients that do not have  ?insurance, or whose insurance does not completely cover the cost of the ZIO monitor.  ?You must apply for the Patient Assistance Program to qualify for this discounted rate.  ?To apply, please call [employer] at [phone], select option 4, select option 2, ask to apply for  ?Patient Assistance Program. Theodore Demark will ask your household income, and how many people  ?are in your household. They will quote your out-of-pocket cost based on that information.  ?[employer] will also be able to set up a 67-month, interest-free payment plan if needed. ? ?Applying the monitor ?  ?Shave hair from upper left chest.  ?Hold abrader disc by orange tab. Rub abrader in 40 strokes over the upper left chest as  ?indicated in your monitor instructions.  ?Clean area with 4 enclosed alcohol pads. Let dry.  ?Apply patch as indicated in monitor instructions. Patch will be placed under collarbone on left  ?side of chest with arrow pointing upward.  ?Rub patch adhesive wings for 2 minutes. Remove white label marked "1". Remove the white  ?label marked "2". Rub patch adhesive wings for 2 additional minutes.  ?  While looking in a mirror, press and release button in center of patch. A small green light will  ?flash 3-4 times. This will be your only indicator that the monitor has been turned on.  ?Do not shower for the first 24 hours. You may shower after the first 24 hours.  ?Press the button if you feel a symptom. You will hear a small click. Record Date, Time and  ?Symptom in the  Patient Logbook.  ?When you are ready to remove the patch, follow instructions on the last 2 pages of Patient  ?Logbook. Stick patch monitor onto the last page of Patient Logbook.  ?Place Patient Logbook in the blue and white box. Use locking tab on box and tape box closed  ?securely. The blue and white box has prepaid postage on it. Please place it in the mailbox as  ?soon as possible. Your physician should have your test results approximately 7 days after the  ?monitor has been mailed back to [facility].  ?Call [facility] at [phone] if you have questions regarding  ?your ZIO XT patch monitor. Call them immediately if you see an orange light blinking on your  ?monitor.  ?If your monitor falls off in less than 4 days, contact our Monitor department at [phone].  ?If your monitor becomes loose or falls off after 4 days call [employer] at [phone] for  ?suggestions on securing your monitor ? ? ? ?Follow-Up: ?At [facility], you and your health needs are our priority.  As part of our continuing mission to provide you with exceptional heart care, we have created designated Provider Care Teams.  These Care Teams include your primary Cardiologist (physician) and Advanced Practice Providers (APPs -  Physician Assistants and Nurse Practitioners) who all work together to provide you with the care you need, when you need it. ? ?We recommend signing up for the patient portal called "MyChart".  Sign up information is provided on this After Visit Summary.  MyChart is used to connect with patients for Virtual Visits (Telemedicine).  Patients are able to view lab/test results, encounter notes, upcoming appointments, etc.  Non-urgent messages can be sent to your provider as well.   ?To learn more about what you can do with MyChart, go to [email or web address].   ? ?Your next appointment:   ?12 week(s) ? ?The format for your next appointment:   ?In Person ? ?Provider:   ?Berniece Salines, DO    ? ? ?Other Instructions ?  ?

## 2021-11-10 ENCOUNTER — Encounter: Payer: Self-pay | Admitting: Cardiology

## 2021-11-11 DIAGNOSIS — I480 Paroxysmal atrial fibrillation: Secondary | ICD-10-CM

## 2021-11-12 ENCOUNTER — Other Ambulatory Visit: Payer: Self-pay

## 2021-11-12 ENCOUNTER — Telehealth: Payer: Self-pay

## 2021-11-12 MED ORDER — METOPROLOL SUCCINATE ER 25 MG PO TB24: 12.5000 mg | ORAL_TABLET | Freq: Every day | ORAL | 0 refills | Status: DC

## 2021-11-12 NOTE — Telephone Encounter (Signed)
Called patient.  See phone note

## 2021-11-12 NOTE — Telephone Encounter (Signed)
Called pt's wife to discuss the medication changes. It was explained to her that her husband is to stop Cardizem as well as Lopressor. 3 days after stopping Cardizem he is to start Toprol-XL. She voiced concerns about him not having a "rescue medication" I shared the concerns with Dr. Harriet Masson. Dr. Harriet Masson spoke with pt's wife and answered questions and addressed her concerns. No further concerns voiced at this time. Prescription for Toprol-Xl 12.5 mg daily sent to pharmacy.  ?

## 2021-11-17 ENCOUNTER — Encounter: Payer: Self-pay | Admitting: Cardiology

## 2021-11-18 ENCOUNTER — Other Ambulatory Visit: Payer: Self-pay | Admitting: Cardiology

## 2021-11-19 ENCOUNTER — Telehealth: Payer: Self-pay | Admitting: *Deleted

## 2021-11-19 ENCOUNTER — Other Ambulatory Visit: Payer: Self-pay

## 2021-11-19 ENCOUNTER — Ambulatory Visit (HOSPITAL_COMMUNITY): Payer: BC Managed Care – PPO | Attending: Cardiology

## 2021-11-19 DIAGNOSIS — I480 Paroxysmal atrial fibrillation: Secondary | ICD-10-CM | POA: Diagnosis not present

## 2021-11-19 LAB — ECHOCARDIOGRAM COMPLETE
Area-P 1/2: 3.42 cm2
S' Lateral: 3.1 cm

## 2021-11-19 NOTE — Telephone Encounter (Signed)
Notified patient wife of HST appointment details. ?

## 2021-11-22 ENCOUNTER — Encounter: Payer: Self-pay | Admitting: Cardiology

## 2021-11-22 NOTE — Telephone Encounter (Signed)
This matter was handled over the phone, see echo results for documentation.  ?

## 2021-11-26 ENCOUNTER — Encounter: Payer: Self-pay | Admitting: Cardiology

## 2021-11-29 NOTE — Telephone Encounter (Signed)
Spoke with patient and wife regarding sob. Patient becomes sob with normal activities ("getting dressed, intimacy, taking out the trash"). Patient denies PND: however, reported that sometimes while sitting in recliner, it's hard to catch a breath. He does not weight himself, so I gave him instructions of how to weigh each morning. He said he does eat a lot of sodium. Discussed sodium restrictions. He denies chest pain/pressure/squeezing. Spoke with Dr. Flora Lipps (DOD) who recommended that patient be seen with Dr. Servando Salina before his scheduled 5/18 appointment. ?

## 2021-11-30 ENCOUNTER — Telehealth: Payer: Self-pay

## 2021-11-30 NOTE — Telephone Encounter (Signed)
Called pt to set up appt. No answer at this time, left message for him to return the call. Per Dr. Tobb is is okay to move up his appt.  ?

## 2021-11-30 NOTE — Telephone Encounter (Signed)
Called pt to set up appt. No answer at this time, left message for him to return the call. Per Dr. Servando Salina is is okay to move up his appt.  ?

## 2021-12-02 DIAGNOSIS — I480 Paroxysmal atrial fibrillation: Secondary | ICD-10-CM | POA: Diagnosis not present

## 2021-12-06 ENCOUNTER — Other Ambulatory Visit: Payer: Self-pay | Admitting: Cardiology

## 2021-12-20 ENCOUNTER — Encounter: Payer: Self-pay | Admitting: Cardiology

## 2021-12-20 ENCOUNTER — Ambulatory Visit: Payer: BC Managed Care – PPO | Admitting: Cardiology

## 2021-12-20 VITALS — BP 114/82 | HR 67 | Ht 71.0 in | Wt 226.4 lb

## 2021-12-20 DIAGNOSIS — R0602 Shortness of breath: Secondary | ICD-10-CM

## 2021-12-20 DIAGNOSIS — Z8616 Personal history of COVID-19: Secondary | ICD-10-CM | POA: Diagnosis not present

## 2021-12-20 DIAGNOSIS — I480 Paroxysmal atrial fibrillation: Secondary | ICD-10-CM

## 2021-12-20 DIAGNOSIS — Z77098 Contact with and (suspected) exposure to other hazardous, chiefly nonmedicinal, chemicals: Secondary | ICD-10-CM

## 2021-12-20 NOTE — Progress Notes (Signed)
?Cardiology Office Note:   ? ?Date:  12/20/2021  ? ?ID:  [name], DOB April 13, 1971, MRN [id number] ? ?PCP:  Curly Rim, MD  ?Cardiologist:  Berniece Salines, DO  ?Electrophysiologist:  None  ? ?Referring MD: Curly Rim, MD  ? ?" I am doing fine" ? ?History of Present Illness:   ? ?[name] is a 51 y.o. male with a hx of asthma, GERD, hyperlipidemia paroxysmal SVT and paroxysmal atrial fibrillation here today for follow-up visit. ? ?At the first visit he had just been diagnosed with atrial fibrillation we discussed his diagnosis as well as started the patient on low-dose Cardizem as he was also hypertensive.  In the interim he had not been able to tolerate the Cardizem we will transition the patient to metoprolol.   ? ?He has had his monitor as well as echocardiogram.  His sleep study this is pending. ? ?He is having intermittent palpitations but not as bad.  He notes that he has had intermittent shortness of breath.  And is inquiring for a referral to pulmonary. ? ?He is here with his wife and they are both looking forward to their anniversary trip in June. ? ?Past Medical History:  ?Diagnosis Date  ? Abnormal involuntary movements(781.0)   ? Allergy   ? Asthma   ? had hx bronchitis-cough-better now  ? GERD (gastroesophageal reflux disease)   ? no meds now-cough gone  ? Hyperlipidemia   ? Lumbago   ? Nephrolithiasis   ? ? ?Past Surgical History:  ?Procedure Laterality Date  ? BACK SURGERY  2006  ? lumb lam  ? HERNIA REPAIR  05/16/2012  ? lih repair  ? INGUINAL HERNIA REPAIR  05/16/2012  ? Procedure: HERNIA REPAIR INGUINAL ADULT;  Surgeon: Harl Bowie, MD;  Location: [place];  Service: General;  Laterality: Left;  inguinal area  ? TONSILLECTOMY    ? [place] EXTRACTION  2005  ? ? ?Current Medications: ?Current Meds  ?Medication Sig  ? famotidine (PEPCID) 20 MG tablet   ? ibuprofen (ADVIL,MOTRIN) 200 MG tablet Take 200 mg by mouth every 6 (six) hours as needed.  ?  metoprolol succinate (TOPROL-XL) 25 MG 24 hr tablet TAKE 1/2 TABLET BY MOUTH EVERY DAY  ?  ? ?Allergies:   Patient has no known allergies.  ? ?Social History  ? ?Socioeconomic History  ? Marital status: Married  ?  Spouse name: Not on file  ? Number of children: Not on file  ? Years of education: Not on file  ? Highest education level: Not on file  ?Occupational History  ? Not on file  ?Tobacco Use  ? Smoking status: Never  ? Smokeless tobacco: Not on file  ?Substance and Sexual Activity  ? Alcohol use: Yes  ? Drug use: No  ? Sexual activity: Not on file  ?Other Topics Concern  ? Not on file  ?Social History Narrative  ? Not on file  ? ?Social Determinants of Health  ? ?Financial Resource Strain: Not on file  ?Food Insecurity: Not on file  ?Transportation Needs: Not on file  ?Physical Activity: Not on file  ?Stress: Not on file  ?Social Connections: Not on file  ?  ? ?Family History: ?The patient's family history is not on file. ? ?ROS:   ?Review of Systems  ?Constitution: Negative for decreased appetite, fever and weight gain.  ?HENT: Negative for congestion, ear discharge, hoarse voice and sore throat.   ?Eyes: Negative for discharge,  redness, vision loss in right eye and visual halos.  ?Cardiovascular: Negative for chest pain, dyspnea on exertion, leg swelling, orthopnea and palpitations.  ?Respiratory: Negative for cough, hemoptysis, shortness of breath and snoring.   ?Endocrine: Negative for heat intolerance and polyphagia.  ?Hematologic/Lymphatic: Negative for bleeding problem. Does not bruise/bleed easily.  ?Skin: Negative for flushing, nail changes, rash and suspicious lesions.  ?Musculoskeletal: Negative for arthritis, joint pain, muscle cramps, myalgias, neck pain and stiffness.  ?Gastrointestinal: Negative for abdominal pain, bowel incontinence, diarrhea and excessive appetite.  ?Genitourinary: Negative for decreased libido, genital sores and incomplete emptying.  ?Neurological: Negative for brief  paralysis, focal weakness, headaches and loss of balance.  ?Psychiatric/Behavioral: Negative for altered mental status, depression and suicidal ideas.  ?Allergic/Immunologic: Negative for HIV exposure and persistent infections.  ? ? ?EKGs/Labs/Other Studies Reviewed:   ? ?The following studies were reviewed today: ? ? ?EKG:  None today  ? ? ? ?Recent Labs: ?11/06/2021: ALT 25; BUN 17; Creatinine, Ser 0.93; Hemoglobin 15.7; Magnesium 2.1; Platelets 324; Potassium 4.1; Sodium 138  ?Recent Lipid Panel ?No results found for: CHOL, TRIG, HDL, CHOLHDL, VLDL, LDLCALC, LDLDIRECT ? ?Physical Exam:   ? ?VS:  BP 114/82   Pulse 67   Ht 5\' 11"  (1.803 m)   Wt 226 lb 6.4 oz (102.7 kg)   SpO2 95%   BMI 31.58 kg/m?    ? ?Wt Readings from Last 3 Encounters:  ?12/20/21 226 lb 6.4 oz (102.7 kg)  ?11/09/21 225 lb (102.1 kg)  ?05/28/12 206 lb (93.4 kg)  ?  ? ?GEN: Well nourished, well developed in no acute distress ?HEENT: Normal ?NECK: No JVD; No carotid bruits ?LYMPHATICS: No lymphadenopathy ?CARDIAC: S1S2 noted,RRR, no murmurs, rubs, gallops ?RESPIRATORY:  Clear to auscultation without rales, wheezing or rhonchi  ?ABDOMEN: Soft, non-tender, non-distended, +bowel sounds, no guarding. ?EXTREMITIES: No edema, No cyanosis, no clubbing ?MUSCULOSKELETAL:  No deformity  ?SKIN: Warm and dry ?NEUROLOGIC:  Alert and oriented x 3, non-focal ?PSYCHIATRIC:  Normal affect, good insight ? ?ASSESSMENT:   ? ?1. History of COVID-19   ?2. SOB (shortness of breath)   ?3. Occupational exposure to chemicals   ?4. PAF (paroxysmal atrial fibrillation) ([place])   ?5. Morbid obesity ([place])   ? ?PLAN:   ? ? ?1.  He has not had any episodes of A-fib.  We will continue patient on his metoprolol succinate 12.5 mg daily.  No need for any anticoagulation at this time.  CHA2DS2-VASc score is 1. ?2.  He has had some shortness of breath which could be multifactorial he has had history of asthma as well as chemical exposures and in the setting of his history of COVID I  like to refer the patient to pulmonary. ? ?The patient understands the need to lose weight with diet and exercise. We have discussed specific strategies for this. ? ?The patient is in agreement with the above plan. The patient left the office in stable condition.  The patient will follow up in 6 months or sooner if needed. ? ? ?Medication Adjustments/Labs and Tests Ordered: ?Current medicines are reviewed at length with the patient today.  Concerns regarding medicines are outlined above.  ?Orders Placed This Encounter  ?Procedures  ? Ambulatory referral to Pulmonology  ? ?No orders of the defined types were placed in this encounter. ? ? ?Patient Instructions  ?Medication Instructions:  ?Your physician recommends that you continue on your current medications as directed. Please refer to the Current Medication list given to you today.  ?*  If you need a refill on your cardiac medications before your next appointment, please call your pharmacy* ? ? ?Lab Work: ?None ?If you have labs (blood work) drawn today and your tests are completely normal, you will receive your results only by: ?MyChart Message (if you have MyChart) OR ?A paper copy in the mail ?If you have any lab test that is abnormal or we need to change your treatment, we will call you to review the results. ? ? ?Testing/Procedures: ?None ? ? ?Follow-Up: ?At [facility], you and your health needs are our priority.  As part of our continuing mission to provide you with exceptional heart care, we have created designated Provider Care Teams.  These Care Teams include your primary Cardiologist (physician) and Advanced Practice Providers (APPs -  Physician Assistants and Nurse Practitioners) who all work together to provide you with the care you need, when you need it. ? ?We recommend signing up for the patient portal called "MyChart".  Sign up information is provided on this After Visit Summary.  MyChart is used to connect with patients for Virtual Visits  (Telemedicine).  Patients are able to view lab/test results, encounter notes, upcoming appointments, etc.  Non-urgent messages can be sent to your provider as well.   ?To learn more about what you can do with MyChart, go to ht

## 2021-12-20 NOTE — Patient Instructions (Signed)

## 2021-12-27 ENCOUNTER — Ambulatory Visit (HOSPITAL_BASED_OUTPATIENT_CLINIC_OR_DEPARTMENT_OTHER): Payer: BC Managed Care – PPO | Attending: Cardiology | Admitting: Cardiovascular Disease

## 2021-12-27 DIAGNOSIS — I4891 Unspecified atrial fibrillation: Secondary | ICD-10-CM | POA: Diagnosis not present

## 2021-12-27 DIAGNOSIS — G4736 Sleep related hypoventilation in conditions classified elsewhere: Secondary | ICD-10-CM | POA: Diagnosis not present

## 2021-12-27 DIAGNOSIS — G4733 Obstructive sleep apnea (adult) (pediatric): Secondary | ICD-10-CM | POA: Insufficient documentation

## 2021-12-27 DIAGNOSIS — R0683 Snoring: Secondary | ICD-10-CM | POA: Insufficient documentation

## 2022-01-03 ENCOUNTER — Encounter (HOSPITAL_BASED_OUTPATIENT_CLINIC_OR_DEPARTMENT_OTHER): Payer: Self-pay | Admitting: Cardiovascular Disease

## 2022-01-03 NOTE — Procedures (Signed)
? ? ? ?  Patient Name: [name], [name] ?Study Date: 12/27/2021 ?Gender: Male ?D.O.B: [birth year] ?Age (years): 89 ?Referring Provider: Thomasene Ripple DO ?Height (inches): 71 ?Interpreting Physician: Nicki Guadalajara MD, ABSM ?Weight (lbs): 225 ?RPSGT: [place] ?BMI: 31 ?MRN: [id number] ?Neck Size: 17.50 ? ?CLINICAL INFORMATION ?Sleep Study Type: HST ? ?Indication for sleep study: Snoring, PAF, obesity ? ?Epworth Sleepiness Score: 8 ? ?SLEEP STUDY TECHNIQUE ?A multi-channel overnight portable sleep study was performed. The channels recorded were: nasal airflow, thoracic respiratory movement, and oxygen saturation with a pulse oximetry. Snoring was also monitored. ? ?MEDICATIONS ?famotidine (PEPCID) 20 MG tablet ?ibuprofen (ADVIL,MOTRIN) 200 MG tablet ?metoprolol succinate (TOPROL-XL) 25 MG 24 hr tablet  ?Patient self administered medications include: N/A. ? ?SLEEP ARCHITECTURE ?Patient was studied for 411.3 minutes. The sleep efficiency was 100.0 % and the patient was supine for 33.8%. The arousal index was 0.0 per hour. ? ?RESPIRATORY PARAMETERS ?The overall AHI was 33.7 per hour, with a central apnea index of 0 per hour. There is a significant positional component with supine sleep AHI 76.5/h versus non-supine sleep AHI 11.9/h. ? ?The oxygen nadir was 83% during sleep. ? ?CARDIAC DATA ?Mean heart rate during sleep was 62.0 bpm. HR range : 50 - 86 bpm. ? ?IMPRESSIONS ?- Severe obstructive sleep apnea occurred during this study (AHI 33.7/h). ?- Moderate oxygen desaturation to a nadir of 83%. Time < 89% was 10.5 minutes. ?- Patient snored for 36.6 minutes (8.9%) during the sleep. ? ?DIAGNOSIS ?- Obstructive Sleep Apnea (G47.33) ?- Nocturnal Hypoxemia (G47.36) ? ?RECOMMENDATIONS ?- In this patient with cardiovascular comorbidities recommend therapeutic CPAP for treatment of severe sleep disordered breathing. If unable to schedule an in-lab titration, intiate Auto - PAP with EPR of 3 at 7 - 18 cm of water. ?- Effort  should be made to optimize nasal and oropharyngeal patency. ?- Positional therapy avoiding supine position during sleep. ?- Avoid alcohol, sedatives and other CNS depressants that may worsen sleep apnea and disrupt normal sleep architecture. ?- Sleep hygiene should be reviewed to assess factors that may improve sleep quality. ?- Weight management (BMI 31) and regular exercise should be initiated or continued. ?- Recommend a download and sleep clinic evaluation after one month of therapy. ? ? ?[Electronically signed] 01/03/2022 02:06 PM ? ?Nicki Guadalajara MD, [facility], ABSM ?Diplomate, Biomedical engineer of Sleep Medicine ? ?NPI: [id number] ? ?[facility] ?PH: [phone]   FX: [phone] ?ACCREDITED BY THE AMERICAN ACADEMY OF SLEEP MEDICINE ? ?

## 2022-01-04 ENCOUNTER — Other Ambulatory Visit: Payer: Self-pay | Admitting: Cardiovascular Disease

## 2022-01-04 ENCOUNTER — Telehealth: Payer: Self-pay | Admitting: *Deleted

## 2022-01-04 DIAGNOSIS — G4736 Sleep related hypoventilation in conditions classified elsewhere: Secondary | ICD-10-CM

## 2022-01-04 DIAGNOSIS — G4733 Obstructive sleep apnea (adult) (pediatric): Secondary | ICD-10-CM

## 2022-01-04 NOTE — Telephone Encounter (Signed)
-----   Message from Lennette Bihari, MD sent at 01/03/2022  2:11 PM EDT ----- ?Burna Mortimer please notify pt of results, set up for CPAP titration/Auto-PAP ?

## 2022-01-04 NOTE — Telephone Encounter (Signed)
Both the patient and wife were informed of his sleep study results and recommendations. After much discussion, and answering his questions he decides to move forward with Dr Landry Dyke recommendations. PA will be submitted to [facility] for CPAP titration. ?

## 2022-01-07 ENCOUNTER — Telehealth: Payer: Self-pay | Admitting: *Deleted

## 2022-01-07 NOTE — Telephone Encounter (Signed)
Prior Authorization for CPAP titration sent to [facility] via web portal. It was denied. APAP and supplies were approved. Order has been sent to [facility]. Patient notified. ?

## 2022-01-09 ENCOUNTER — Encounter: Payer: Self-pay | Admitting: Cardiology

## 2022-01-14 ENCOUNTER — Encounter: Payer: Self-pay | Admitting: Pulmonary Disease

## 2022-01-14 ENCOUNTER — Ambulatory Visit: Payer: BC Managed Care – PPO | Admitting: Pulmonary Disease

## 2022-01-14 VITALS — BP 118/64 | HR 63 | Ht 71.0 in | Wt 223.2 lb

## 2022-01-14 DIAGNOSIS — G4733 Obstructive sleep apnea (adult) (pediatric): Secondary | ICD-10-CM | POA: Diagnosis not present

## 2022-01-14 DIAGNOSIS — R0602 Shortness of breath: Secondary | ICD-10-CM | POA: Diagnosis not present

## 2022-01-14 NOTE — Progress Notes (Signed)
Synopsis: Referred in May 2023 for shortness of breath  Subjective:   PATIENT ID: [name] GENDER: male DOB: Jan 24, 1971, MRN: [id number]  HPI  Chief Complaint  Patient presents with   Consult    Referred by cardiology for increased SOB over the past 6 months. Notices it more with exertion. 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    [name] is a 51 year old male, never smoker with severe obstructive sleep apnea, paroxysmal atrial fibrillation and obesity who is referred to pulmonary clinic for shortness of breath.   He reports having shortness of breath over the past 6 months. The shortness of breath is mainly with exertion. He denies cough or wheezing. He reports the dyspnea has been more noticeable since having covid infection last year.   He has history of frequent episodes of bronchitis and coughing fits when he was working for a [employer] where he was exposed to lots of dusts. He was previously on a maintenance inhaler at that time.   He has been diagnosed with severe obstructive sleep apnea and will be starting CPAP therapy soon. He reports significant weight gain and inactivity since the pandemic started.  He is a never smoker. He lives with with his wife and children.  Past Medical History:  Diagnosis Date   Abnormal involuntary movements(781.0)    Allergy    Asthma    had hx bronchitis-cough-better now   GERD (gastroesophageal reflux disease)    no meds now-cough gone   Hyperlipidemia    Lumbago    Nephrolithiasis      No family history on file.   Social History   Socioeconomic History   Marital status: Married    Spouse name: Not on file   Number of children: Not on file   Years of education: Not on file   Highest education level: Not on file  Occupational History   Not on file  Tobacco Use   Smoking status: Never   Smokeless tobacco: Not on file   Substance and Sexual Activity   Alcohol use: Yes   Drug use: No   Sexual activity: Not on file  Other Topics Concern   Not on file  Social History Narrative   Not on file   Social Determinants of Health   Financial Resource Strain: Not on file  Food Insecurity: Not on file  Transportation Needs: Not on file  Physical Activity: Not on file  Stress: Not on file  Social Connections: Not on file  Intimate Partner Violence: Not on file     No Known Allergies   Outpatient Medications Prior to Visit  Medication Sig Dispense Refill   famotidine (PEPCID) 20 MG tablet      ibuprofen (ADVIL,MOTRIN) 200 MG tablet Take 200 mg by mouth every 6 (six) hours as needed.     metoprolol succinate (TOPROL-XL) 25 MG 24 hr tablet TAKE 1/2 TABLET BY MOUTH EVERY DAY 45 tablet 3   No facility-administered medications prior to visit.   Review of Systems  Constitutional:  Negative for chills, fever, malaise/fatigue and weight loss.  HENT:  Negative for congestion, sinus pain and sore throat.   Eyes: Negative.   Respiratory:  Positive for shortness of breath. Negative for cough, hemoptysis, sputum production and wheezing.   Cardiovascular:  Negative for chest pain, palpitations, orthopnea, claudication and leg swelling.  Gastrointestinal:  Positive for heartburn. Negative for abdominal pain,  nausea and vomiting.  Genitourinary: Negative.   Musculoskeletal:  Negative for joint pain and myalgias.  Skin:  Negative for rash.  Neurological:  Negative for weakness.  Endo/Heme/Allergies: Negative.   Psychiatric/Behavioral: Negative.     Objective:   Vitals:   01/14/22 1529  BP: 118/64  Pulse: 63  SpO2: 98%  Weight: 223 lb 3.2 oz (101.2 kg)  Height: 5\' 11"  (1.803 m)    Physical Exam Constitutional:      General: He is not in acute distress.    Appearance: He is obese.  HENT:     Head: Normocephalic and atraumatic.  Eyes:     Extraocular Movements: Extraocular movements intact.      Conjunctiva/sclera: Conjunctivae normal.     Pupils: Pupils are equal, round, and reactive to light.  Cardiovascular:     Rate and Rhythm: Normal rate and regular rhythm.     Pulses: Normal pulses.     Heart sounds: Normal heart sounds. No murmur heard. Pulmonary:     Effort: Pulmonary effort is normal. No respiratory distress.     Breath sounds: Normal breath sounds. No wheezing or rhonchi.  Abdominal:     General: Bowel sounds are normal.     Palpations: Abdomen is soft.  Musculoskeletal:     Right lower leg: No edema.     Left lower leg: No edema.  Lymphadenopathy:     Cervical: No cervical adenopathy.  Skin:    General: Skin is warm and dry.  Neurological:     General: No focal deficit present.     Mental Status: He is alert.  Psychiatric:        Mood and Affect: Mood normal.        Behavior: Behavior normal.        Thought Content: Thought content normal.        Judgment: Judgment normal.   CBC    Component Value Date/Time   WBC 9.1 11/06/2021 2052   RBC 5.21 11/06/2021 2052   HGB 15.7 11/06/2021 2052   HCT 46.4 11/06/2021 2052   PLT 324 11/06/2021 2052   MCV 89.1 11/06/2021 2052   MCH 30.1 11/06/2021 2052   MCHC 33.8 11/06/2021 2052   RDW 13.0 11/06/2021 2052   LYMPHSABS 2.9 11/06/2021 2052   MONOABS 0.9 11/06/2021 2052   EOSABS 0.2 11/06/2021 2052   BASOSABS 0.1 11/06/2021 2052      Latest Ref Rng & Units 11/06/2021    8:52 PM  BMP  Glucose 70 - 99 mg/dL 454103    BUN 6 - 20 mg/dL 17    Creatinine 0.980.61 - 1.24 mg/dL 1.190.93    Sodium 147135 - 829145 mmol/L 138    Potassium 3.5 - 5.1 mmol/L 4.1    Chloride 98 - 111 mmol/L 107    CO2 22 - 32 mmol/L 20    Calcium 8.9 - 10.3 mg/dL 8.8     Chest imaging: CXR 11/06/21 The heart size and mediastinal contours are within normal limits. Both lungs are clear. The visualized skeletal structures are unremarkable.  PFT:     View : No data to display.          Labs:  Path:  Echo:  Heart  Catheterization:  Assessment & Plan:   Shortness of breath - Plan: CT Chest High Resolution  Severe obstructive sleep apnea  Discussion: [name] EuropeDwight [name] is a 51 year old male, never smoker with severe obstructive sleep apnea, paroxysmal atrial fibrillation and obesity who is referred  to pulmonary clinic for shortness of breath.   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 He does not appear to have reactive airways symptoms at this time. Untreated sleep apnea may also be contributing to his dyspnea.  We will check a HRCT Chest for further evaluation.   Follow up in 2 months for pulmonary function tests.  Melody Comas, MD [facility] Office: [phone]   Current Outpatient Medications:    famotidine (PEPCID) 20 MG tablet, , Disp: , Rfl:    ibuprofen (ADVIL,MOTRIN) 200 MG tablet, Take 200 mg by mouth every 6 (six) hours as needed., Disp: , Rfl:    metoprolol succinate (TOPROL-XL) 25 MG 24 hr tablet, TAKE 1/2 TABLET BY MOUTH EVERY DAY, Disp: 45 tablet, Rfl: 3

## 2022-01-14 NOTE — Patient Instructions (Signed)
We will check a high resolution CT Chest scan and pulmonary function tests to evaluate your shortness of breath ? ?We will see how you tolerate the CPAP machine before referring to an ENT for further evaluation of the nose and sinuses. ? ?Follow up in 2 months.  ?

## 2022-01-20 ENCOUNTER — Ambulatory Visit: Payer: BC Managed Care – PPO | Admitting: Cardiology

## 2022-01-21 ENCOUNTER — Encounter: Payer: Self-pay | Admitting: Cardiology

## 2022-01-26 ENCOUNTER — Encounter: Payer: Self-pay | Admitting: Pulmonary Disease

## 2022-01-27 ENCOUNTER — Encounter: Payer: Self-pay | Admitting: Pulmonary Disease

## 2022-01-27 NOTE — Telephone Encounter (Signed)
The auth location has been updated.

## 2022-01-27 NOTE — Telephone Encounter (Signed)
Appointment is moved to [facility] wife is aware of new appt

## 2022-01-27 NOTE — Telephone Encounter (Signed)
PCC's, pt has cancelled his HRCT due to out pocket cost. The pt's wife thinks this cost may be due to the imaging location. Is this true and is there another location that pt can have scan that would be cheaper? Thanks.

## 2022-01-28 ENCOUNTER — Ambulatory Visit (HOSPITAL_BASED_OUTPATIENT_CLINIC_OR_DEPARTMENT_OTHER): Payer: BC Managed Care – PPO

## 2022-01-29 ENCOUNTER — Telehealth: Payer: Self-pay | Admitting: Physician Assistant

## 2022-01-29 NOTE — Telephone Encounter (Signed)
Paged by answering service.  Patient is diagnosed with COVID this morning.  He got from family.  Recent diagnosis of atrial fibrillation and undergoing pulmonary work-up as well. Wife called to make sure it safe to take Paxlovid with metoprolol.  Reassurance given.  Also discussed physiology of atrial fibrillation as well as COVID.  If persistent or worsening palpitation patient will take full tablet of Toprol-XL.  If alarming symptoms, comes to ER for further evaluation.

## 2022-01-30 ENCOUNTER — Encounter: Payer: Self-pay | Admitting: Pulmonary Disease

## 2022-02-02 ENCOUNTER — Ambulatory Visit (INDEPENDENT_AMBULATORY_CARE_PROVIDER_SITE_OTHER)
Admission: RE | Admit: 2022-02-02 | Discharge: 2022-02-02 | Disposition: A | Discharge status: Home / Self Care | Payer: BC Managed Care – PPO | Source: Ambulatory Visit | Attending: Pulmonary Disease | Admitting: Pulmonary Disease

## 2022-02-02 DIAGNOSIS — R0602 Shortness of breath: Secondary | ICD-10-CM

## 2022-02-02 DIAGNOSIS — J8489 Other specified interstitial pulmonary diseases: Secondary | ICD-10-CM | POA: Diagnosis not present

## 2022-02-02 DIAGNOSIS — R918 Other nonspecific abnormal finding of lung field: Secondary | ICD-10-CM | POA: Diagnosis not present

## 2022-02-02 DIAGNOSIS — I2510 Atherosclerotic heart disease of native coronary artery without angina pectoris: Secondary | ICD-10-CM | POA: Diagnosis not present

## 2022-02-03 ENCOUNTER — Encounter: Payer: Self-pay | Admitting: Pulmonary Disease

## 2022-02-04 NOTE — Telephone Encounter (Signed)
Pt spouse states she is concerned about ct scan results and wants a phone call back before the weekend. Pt is following up from a mychart message on 02/03/22

## 2022-02-08 ENCOUNTER — Telehealth: Payer: Self-pay | Admitting: Internal Medicine

## 2022-02-08 ENCOUNTER — Telehealth: Payer: Self-pay | Admitting: Cardiology

## 2022-02-08 NOTE — Telephone Encounter (Signed)
Returned call to patients wife (okay per DPR) who states that patient went into Atrial fib last night around 10:40, patient denies any shortness of breath, chest pain, or dizziness. Patients wife reports that patient just feels a little jittery and can feel the palpitations/fluttering. Patient wife states they called the on call last night who advised patient to take full tablet of metoprolol and to call back today if patient did not convert back to sinus to get order for pill in pocket (dilt or Flecainide) Per patients wife patient's HR has been anywhere between 70-97 but mostly in the 90's. Patients wife would like to know if we can send in medication for patient- patient is currently on  vacation in [place]. Confirmed with patient's wife that patient went into Atrial fib around 10:40pm and also patient has never had dose of Flecainide previously. Patients wife states she just wants a pill to get patient back into rhythm so patient can finish his vacation.   Spoke with Dr. Jens Som (DOD) who states that patient will have to be seen in the ER for first dose of flecainide for monitoring. Since patient is in the 24 hour time frame Dr. Jens Som recommends patient go to ER for possible Flecainide dose for conversion back to sinus.   Spoke with patients wife who states that she is NOT taking patient to ER and states she does not feel like he needs to be seen in the ER. Patients wife would like to know if patient can just continue his Metoprolol at a whole tablet (25 mg) until he returns from his trip. Spoke with DOD who states patient can continue to take a whole tablet of metoprolol succinate 25mg  but states that patient should report to the ER if symptoms were to either worsen in any way or new symptoms develop.   When ending the phone call patient states that he feels like he has converted back to sinus as he no longer feels any palpitations and HR is 70. Patients wife would like to know if patient should  continue the Metoprolol 25mg  as she feels that this has helped the patient. Advised patient's wife I would forward message over to Dr. for her to review and advise. Patient's wife verbalized understanding.   Scheduled patient an appointment with Dr. on 6/13 to discuss treatment plan.    Will forward to MD.

## 2022-02-08 NOTE — Telephone Encounter (Signed)
   Patient c/o Palpitations:  High priority if patient c/o lightheadedness, shortness of breath, or chest pain  How long have you had palpitations/irregular HR/ Afib? Started last night   Are you having the symptoms now? yes   Are you currently experiencing lightheadedness, SOB or CP? no  Do you have a history of afib (atrial fibrillation) or irregular heart rhythm? yes  Have you checked your BP or HR? (document readings if available): yes but wife did not provide numbers . She just said his HR was low   Are you experiencing any other symptoms? Fatigue  Patient is on Vacation in [place] and palpitations started last night. The Wife called an On Call MD and was told by the on call MD to have his Cardiologist prescribe a "Pill in a pocket" . Please send medication to [id number] - [place], [place] - [address] AT [facility] & [address]

## 2022-02-08 NOTE — Telephone Encounter (Signed)
I was called by the patient's wife to inform me that the patient was back in atrial fibrillation, but not in RVR. He was overall without major symptoms except for jitter / anxiety. I initially recommended that the patient be evaluated in the emergency department however they are in [place] vacation in the mountains.   Given that he is currently tolerating afib, he took an extra metoprolol. My recommendations were to take a full tablet metop XL 25 mg in the AM. If the afib persists, then they can call back to consider flecanide or diltiazem.  I gave them the red flag symptoms to call 911 to go to the emergency department and they stated understanding.  Gerre Scull, MD Cardiology

## 2022-02-10 ENCOUNTER — Encounter: Payer: Self-pay | Admitting: Cardiology

## 2022-02-11 DIAGNOSIS — G4733 Obstructive sleep apnea (adult) (pediatric): Secondary | ICD-10-CM | POA: Diagnosis not present

## 2022-02-15 ENCOUNTER — Encounter: Payer: Self-pay | Admitting: Cardiology

## 2022-02-15 ENCOUNTER — Ambulatory Visit: Payer: BC Managed Care – PPO | Admitting: Cardiology

## 2022-02-16 NOTE — Telephone Encounter (Signed)
Returned call to wife to offer phone visit for tomorrow 6/15 at 9:40 am. Wife began to raise voice and stated "I have already told yall that my husband can not have a visit in the morning, because he can't miss any more time from work!" Wife said she feels doctor Tobb no longer has any time for her pts and will discuss with her husband tonight regarding switching to Dr. Jacques Navy. Wife stated she only needed two questions answered and is very frustrated pt is required a phone or in person visit just to answer questions.   Nurse explained triage process and also the process for switching providers. Wife again stated she will discuss with husband tonight and send a message via mychart.

## 2022-02-22 ENCOUNTER — Encounter: Payer: Self-pay | Admitting: Cardiology

## 2022-03-01 ENCOUNTER — Telehealth (HOSPITAL_BASED_OUTPATIENT_CLINIC_OR_DEPARTMENT_OTHER): Payer: Self-pay

## 2022-03-11 ENCOUNTER — Encounter: Payer: Self-pay | Admitting: Physician Assistant

## 2022-03-11 ENCOUNTER — Ambulatory Visit: Payer: BC Managed Care – PPO | Admitting: Physician Assistant

## 2022-03-11 VITALS — BP 126/86 | HR 60 | Ht 70.0 in | Wt 229.0 lb

## 2022-03-11 DIAGNOSIS — E785 Hyperlipidemia, unspecified: Secondary | ICD-10-CM

## 2022-03-11 DIAGNOSIS — I480 Paroxysmal atrial fibrillation: Secondary | ICD-10-CM | POA: Diagnosis not present

## 2022-03-11 MED ORDER — METOPROLOL SUCCINATE ER 25 MG PO TB24: 25.0000 mg | ORAL_TABLET | Freq: Every day | ORAL | 3 refills | Status: DC

## 2022-03-11 NOTE — Patient Instructions (Signed)
Medication Instructions:  INCREASE Metoprolol Succinate to 25 mg daily *If you need a refill on your cardiac medications before your next appointment, please call your pharmacy*  Lab Work: Your physician recommends that you return for lab work in TODAY:  Lipid panel LP(a)   If you have labs (blood work) drawn today and your tests are completely normal, you will receive your results only by: MyChart Message (if you have MyChart) OR A paper copy in the mail If you have any lab test that is abnormal or we need to change your treatment, we will call you to review the results.  Testing/Procedures: NONE ordered at this time of appointment   Follow-Up: At [facility], you and your health needs are our priority.  As part of our continuing mission to provide you with exceptional heart care, we have created designated Provider Care Teams.  These Care Teams include your primary Cardiologist (physician) and Advanced Practice Providers (APPs -  Physician Assistants and Nurse Practitioners) who all work together to provide you with the care you need, when you need it.    Your next appointment:   1 month(s)  The format for your next appointment:   In Person  Provider:   Azalee Course, PA-C on a day Dr. Servando Salina is in office         Other Instructions   Important Information About Sugar

## 2022-03-11 NOTE — Progress Notes (Signed)
Cardiology Office Note:    Date:  03/13/2022   ID:  [name], DOB [birth year], MRN [id number]  PCP:  Vivien Presto, MD   [facility] Providers Cardiologist:  Thomasene Ripple, DO     Referring MD: Vivien Presto, MD   Chief Complaint  Patient presents with   Follow-up    Seen for Dr. Servando Salina    History of Present Illness:    [name] is a 51 y.o. male with a hx of asthma, GERD, HLD, PSVT and paroxysmal atrial fibrillation.  Patient was first seen in March 2023 after being diagnosed with atrial fibrillation.  He was intolerant of diltiazem and was later switched to metoprolol succinate for rate control.  Echocardiogram obtained on 11/19/2021 showed EF 60 to 65%, mild AI.  Heart monitor for 14 days demonstrated minimal heart rate 44 bpm, maximum heart rate 169 bpm, average heart rate 72 bpm, predominant rhythm was sinus rhythm 1 9 SVT, fastest interval 12 beats at a maximal heart rate of 169 bpm.  PVC burden less than 1%.  No A-fib was noted. He was not started on anticoagulation given CHA2DS2-VASc score of 1.  Patient was last seen by Dr. Servando Salina in April 2023, he was having some shortness of breath associated with history of COVID and asthma.  He was referred to pulmonology service.  High-resolution CT obtained on 02/02/2022 showed minimally irregular perihilar interstitial opacity in bilateral lung bases most suggestive of sequela of previous infection or aspiration, small airway disease was noted with coronary artery disease as well.  Patient presents today accompanied by his wife.  He recently had another episode of atrial fibrillation.  Based on physical exam, he has since converted back to sinus rhythm.  I recommended increasing metoprolol succinate from the previous 12.5 mg daily to 25 mg daily.  He continued to have some degree of dyspnea on exertion.  Some of this may be related to history of asthma.  Recent hives resolution CT also revealed some coronary artery  atherosclerosis as well.  I will discuss with the reader to see if a separate coronary calcium scoring study is needed.  Patient thinks that he will be interested in knowing his future cardiovascular risk as well.  I also recommended a fasting lipid panel and lipoprotein a to assess his cardiovascular risk.  Note, if he does not have significant coronary artery disease, he may be a candidate for pill in the pocket flecainide in the future.  However the first time we try the pill in the pocket flecainide strategy will need to be in the hospital setting to make sure he does not have significant arrhythmia afterward.  Therefore, this will only be a option if he does have recurrence of A-fib that make him go to the emergency room.  We discussed criteria this on when he should seek more urgent medical attention in the hospital setting versus managed as outpatient.  His wife is a Engineer, civil (consulting).  Past Medical History:  Diagnosis Date   Abnormal involuntary movements(781.0)    Allergy    Asthma    had hx bronchitis-cough-better now   GERD (gastroesophageal reflux disease)    no meds now-cough gone   Hyperlipidemia    Lumbago    Nephrolithiasis     Past Surgical History:  Procedure Laterality Date   BACK SURGERY  2006   lumb lam   HERNIA REPAIR  05/16/2012   lih repair   INGUINAL HERNIA REPAIR  05/16/2012  Procedure: HERNIA REPAIR INGUINAL ADULT;  Surgeon: Shelly Rubenstein, MD;  Location: [facility];  Service: General;  Laterality: Left;  inguinal area   TONSILLECTOMY     WISDOM TOOTH EXTRACTION  2005    Current Medications: Current Meds  Medication Sig   eszopiclone (LUNESTA) 2 MG TABS tablet Take by mouth. Take on weekends only     Allergies:   Patient has no known allergies.   Social History   Socioeconomic History   Marital status: Married    Spouse name: Not on file   Number of children: Not on file   Years of education: Not on file   Highest education level: Not on  file  Occupational History   Not on file  Tobacco Use   Smoking status: Never   Smokeless tobacco: Not on file  Substance and Sexual Activity   Alcohol use: Yes   Drug use: No   Sexual activity: Not on file  Other Topics Concern   Not on file  Social History Narrative   Not on file   Social Determinants of Health   Financial Resource Strain: Not on file  Food Insecurity: Not on file  Transportation Needs: Not on file  Physical Activity: Not on file  Stress: Not on file  Social Connections: Not on file     Family History: The patient's family history is not on file.  ROS:   Please see the history of present illness.     All other systems reviewed and are negative.  EKGs/Labs/Other Studies Reviewed:    The following studies were reviewed today:  Echo 11/19/2021 1. Left ventricular ejection fraction, by estimation, is 60 to 65%. The  left ventricle has normal function. The left ventricle has no regional  wall motion abnormalities. Left ventricular diastolic parameters were  normal.   2. Right ventricular systolic function is normal. The right ventricular  size is moderately enlarged. Tricuspid regurgitation signal is inadequate  for assessing PA pressure.   3. The mitral valve is normal in structure. No evidence of mitral valve  regurgitation. No evidence of mitral stenosis.   4. The aortic valve is tricuspid. Aortic valve regurgitation is mild.  Aortic valve sclerosis is present, with no evidence of aortic valve  stenosis.   5. The inferior vena cava is normal in size with greater than 50%  respiratory variability, suggesting right atrial pressure of 3 mmHg.   EKG:  EKG is not ordered today.    Recent Labs: 11/06/2021: ALT 25; BUN 17; Creatinine, Ser 0.93; Hemoglobin 15.7; Magnesium 2.1; Platelets 324; Potassium 4.1; Sodium 138  Recent Lipid Panel No results found for: "CHOL", "TRIG", "HDL", "CHOLHDL", "VLDL", "LDLCALC", "LDLDIRECT"   Risk  Assessment/Calculations:    CHA2DS2-VASc Score = 1   This indicates a 0.6% annual risk of stroke. The patient's score is based upon: CHF History: 0 HTN History: 0 Diabetes History: 0 Stroke History: 0 Vascular Disease History: 1 Age Score: 0 Gender Score: 0           Physical Exam:    VS:  BP 126/86   Pulse 60   Ht 5\' 10"  (1.778 m)   Wt 229 lb (103.9 kg)   SpO2 96%   BMI 32.86 kg/m     Wt Readings from Last 3 Encounters:  03/11/22 229 lb (103.9 kg)  01/14/22 223 lb 3.2 oz (101.2 kg)  12/27/21 223 lb (101.2 kg)     GEN:  Well nourished, well developed in no  acute distress HEENT: Normal NECK: No JVD; No carotid bruits LYMPHATICS: No lymphadenopathy CARDIAC: RRR, no murmurs, rubs, gallops RESPIRATORY:  Clear to auscultation without rales, wheezing or rhonchi  ABDOMEN: Soft, non-tender, non-distended MUSCULOSKELETAL:  No edema; No deformity  SKIN: Warm and dry NEUROLOGIC:  Alert and oriented x 3 PSYCHIATRIC:  Normal affect   ASSESSMENT:    1. PAF (paroxysmal atrial fibrillation) (HCC)   2. Hyperlipidemia, unspecified hyperlipidemia type    PLAN:    In order of problems listed above:  Paroxysmal atrial fibrillation: Not on anticoagulation given low CHA2DS2-VASc score.  Echocardiogram obtained in March 2023 showed normal EF.  He had another episode of atrial fibrillation that occurred several weeks ago.  I recommended increase Toprol-XL from the previous 12.5 mg daily to 25 mg daily.  He has a history of hypotension on diltiazem.  His wife who is a nurse can monitor his blood pressure after the increased dose of metoprolol succinate.  We will obtain TSH  Hyperlipidemia: He is currently not on any medication for this.  I recommended a fasting lipid panel and lipoprotein a to assess his cardiovascular risk.  He is also known concern was dyspnea on exertion, some of this may be related to asthma, I briefly discussed with him possibility of cardiac calcium scoring test,  he is interested.  He just recently had a high-resolution CT, I am not sure if this will be good enough to look at the coronary calcium.  Will discuss with the reader.            Medication Adjustments/Labs and Tests Ordered: Current medicines are reviewed at length with the patient today.  Concerns regarding medicines are outlined above.  Orders Placed This Encounter  Procedures   TSH   Lipid panel   Lipoprotein A (LPA)   Meds ordered this encounter  Medications   metoprolol succinate (TOPROL-XL) 25 MG 24 hr tablet    Sig: Take 1 tablet (25 mg total) by mouth daily.    Dispense:  90 tablet    Refill:  3    Patient Instructions  Medication Instructions:  INCREASE Metoprolol Succinate to 25 mg daily *If you need a refill on your cardiac medications before your next appointment, please call your pharmacy*  Lab Work: Your physician recommends that you return for lab work in TODAY:  Lipid panel LP(a)   If you have labs (blood work) drawn today and your tests are completely normal, you will receive your results only by: MyChart Message (if you have MyChart) OR A paper copy in the mail If you have any lab test that is abnormal or we need to change your treatment, we will call you to review the results.  Testing/Procedures: NONE ordered at this time of appointment   Follow-Up: At [facility], you and your health needs are our priority.  As part of our continuing mission to provide you with exceptional heart care, we have created designated Provider Care Teams.  These Care Teams include your primary Cardiologist (physician) and Advanced Practice Providers (APPs -  Physician Assistants and Nurse Practitioners) who all work together to provide you with the care you need, when you need it.    Your next appointment:   1 month(s)  The format for your next appointment:   In Person  Provider:   Azalee Course, PA-C on a day Dr. Servando Salina is in office         Other  Instructions   Important Information About Sugar  Ramond Dial, [place]  03/13/2022 11:53 PM    [facility]

## 2022-03-13 ENCOUNTER — Encounter: Payer: Self-pay | Admitting: Physician Assistant

## 2022-03-13 DIAGNOSIS — G4733 Obstructive sleep apnea (adult) (pediatric): Secondary | ICD-10-CM | POA: Diagnosis not present

## 2022-03-14 ENCOUNTER — Encounter: Payer: Self-pay | Admitting: Pulmonary Disease

## 2022-03-16 ENCOUNTER — Telehealth: Payer: Self-pay | Admitting: Physician Assistant

## 2022-03-16 ENCOUNTER — Ambulatory Visit (INDEPENDENT_AMBULATORY_CARE_PROVIDER_SITE_OTHER): Payer: BC Managed Care – PPO | Admitting: Pulmonary Disease

## 2022-03-16 DIAGNOSIS — R0602 Shortness of breath: Secondary | ICD-10-CM

## 2022-03-16 LAB — PULMONARY FUNCTION TEST
DL/VA % pred: 117 %
DL/VA: 5.14 ml/min/mmHg/L
DLCO cor % pred: 111 %
DLCO cor: 32.15 ml/min/mmHg
DLCO unc % pred: 111 %
DLCO unc: 32.15 ml/min/mmHg
FEF 25-75 Post: 3.64 L/sec
FEF 25-75 Pre: 4.95 L/sec
FEF2575-%Change-Post: -26 %
FEF2575-%Pred-Post: 109 %
FEF2575-%Pred-Pre: 148 %
FEV1-%Change-Post: -6 %
FEV1-%Pred-Post: 94 %
FEV1-%Pred-Pre: 100 %
FEV1-Post: 3.62 L
FEV1-Pre: 3.87 L
FEV1FVC-%Change-Post: 5 %
FEV1FVC-%Pred-Pre: 109 %
FEV6-%Change-Post: -8 %
FEV6-%Pred-Post: 85 %
FEV6-%Pred-Pre: 93 %
FEV6-Post: 4.08 L
FEV6-Pre: 4.46 L
FEV6FVC-%Pred-Post: 103 %
FEV6FVC-%Pred-Pre: 103 %
FVC-%Change-Post: -11 %
FVC-%Pred-Post: 81 %
FVC-%Pred-Pre: 92 %
FVC-Post: 4.08 L
FVC-Pre: 4.6 L
Post FEV1/FVC ratio: 89 %
Post FEV6/FVC ratio: 100 %
Pre FEV1/FVC ratio: 84 %
Pre FEV6/FVC Ratio: 100 %
RV % pred: 75 %
RV: 1.59 L
TLC % pred: 87 %
TLC: 6.12 L

## 2022-03-16 MED ORDER — METOPROLOL SUCCINATE ER 25 MG PO TB24: 12.5000 mg | ORAL_TABLET | Freq: Every day | ORAL | 3 refills | Status: DC

## 2022-03-16 NOTE — Telephone Encounter (Signed)
Called and spoke with patient's wife, ok to reduce toprol xl to 12.5mg  daily instead of 25mg  daily. Please update med list

## 2022-03-16 NOTE — Telephone Encounter (Signed)
I discussed the case with Dr. Bjorn Pippin, if the patient does require a coronary calcium score, he will require a separate test.  Unfortunately we cannot calculate the coronary calcium score based on the recent high-resolution CT.  I discussed the case with both him and his wife, they wish to hold off on the study at this time.  He just had a PFT today to evaluate for pulmonary etiology behind his dyspnea.  If dyspnea cannot be explained by pulmonary etiology, may consider additional cardiac study in the future.

## 2022-03-16 NOTE — Addendum Note (Signed)
Addended by: Dorris Fetch on: 03/16/2022 09:36 AM   Modules accepted: Orders

## 2022-03-16 NOTE — Progress Notes (Signed)
Full PFT performed today. °

## 2022-03-16 NOTE — Progress Notes (Addendum)
Updated medication list per Azalee Course, PA-C to reflect medication change of Metoprolol Succinate back to 12.5 mg daily.

## 2022-03-16 NOTE — Patient Instructions (Signed)
Full PFT performed today. °

## 2022-03-18 ENCOUNTER — Ambulatory Visit: Payer: BC Managed Care – PPO | Admitting: Pulmonary Disease

## 2022-03-18 ENCOUNTER — Encounter: Payer: Self-pay | Admitting: Pulmonary Disease

## 2022-03-18 VITALS — BP 124/72 | HR 76 | Ht 70.0 in | Wt 228.8 lb

## 2022-03-18 DIAGNOSIS — R0602 Shortness of breath: Secondary | ICD-10-CM | POA: Diagnosis not present

## 2022-03-18 DIAGNOSIS — J4520 Mild intermittent asthma, uncomplicated: Secondary | ICD-10-CM | POA: Diagnosis not present

## 2022-03-18 MED ORDER — FLUTICASONE PROPIONATE HFA 110 MCG/ACT IN AERO: 2.0000 | INHALATION_SPRAY | Freq: Two times a day (BID) | RESPIRATORY_TRACT | 12 refills | Status: DC

## 2022-03-18 NOTE — Progress Notes (Unsigned)
Synopsis: Referred in May 2023 for shortness of breath  Subjective:   PATIENT ID: [name] GENDER: male DOB: 1971/01/20, MRN: [id number]  Still having dyspnea with exertion   HPI  Chief Complaint  Patient presents with   Follow-up    F/U after CT and PFT. States breathing has not changed since last visit.    [name] is a 51 year old male, never smoker with severe obstructive sleep apnea, paroxysmal atrial fibrillation and obesity who returns to pulmonary clinic for shortness of breath.   PFTs today are within normal limits.   HRCT Chest 02/02/22 showed minimal irregular interstitial opacity at bilateral lung bases and mild lobular air trapping on expiratory phase imaging.   His shortness of breath remains stable since last visit.   OV 01/14/22 He reports having shortness of breath over the past 6 months. The shortness of breath is mainly with exertion. He denies cough or wheezing. He reports the dyspnea has been more noticeable since having covid infection last year.   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 He was previously on a maintenance inhaler at that time.   He has been diagnosed with severe obstructive sleep apnea and will be starting CPAP therapy soon. He reports significant weight gain and inactivity since the pandemic started.  He is a never smoker. He lives with with his wife and children.  Past Medical History:  Diagnosis Date   Abnormal involuntary movements(781.0)    Allergy    Asthma    had hx bronchitis-cough-better now   GERD (gastroesophageal reflux disease)    no meds now-cough gone   Hyperlipidemia    Lumbago    Nephrolithiasis      No family history on file.   Social History   Socioeconomic History   Marital status: Married    Spouse name: Not on file   Number of children: Not on file   Years of education:  Not on file   Highest education level: Not on file  Occupational History   Not on file  Tobacco Use   Smoking status: Never   Smokeless tobacco: Not on file  Substance and Sexual Activity   Alcohol use: Yes   Drug use: No   Sexual activity: Not on file  Other Topics Concern   Not on file  Social History Narrative   Not on file   Social Determinants of Health   Financial Resource Strain: Not on file  Food Insecurity: Not on file  Transportation Needs: Not on file  Physical Activity: Not on file  Stress: Not on file  Social Connections: Not on file  Intimate Partner Violence: Not on file     No Known Allergies   Outpatient Medications Prior to Visit  Medication Sig Dispense Refill   eszopiclone (LUNESTA) 2 MG TABS tablet Take by mouth. Take on weekends only     famotidine (PEPCID) 20 MG tablet      ibuprofen (ADVIL,MOTRIN) 200 MG tablet Take 200 mg by mouth every 6 (six) hours as needed.     metoprolol succinate (TOPROL-XL) 25 MG 24 hr tablet Take 0.5 tablets (12.5 mg total) by mouth daily. 45 tablet 3   No facility-administered medications prior to visit.   Review of Systems  Constitutional:  Negative for chills, fever, malaise/fatigue and weight loss.  HENT:  Negative for congestion, sinus pain and  sore throat.   Eyes: Negative.   Respiratory:  Positive for shortness of breath. Negative for cough, hemoptysis, sputum production and wheezing.   Cardiovascular:  Negative for chest pain, palpitations, orthopnea, claudication and leg swelling.  Gastrointestinal:  Positive for heartburn. Negative for abdominal pain, nausea and vomiting.  Genitourinary: Negative.   Musculoskeletal:  Negative for joint pain and myalgias.  Skin:  Negative for rash.  Neurological:  Negative for weakness.  Endo/Heme/Allergies: Negative.   Psychiatric/Behavioral: Negative.      Objective:   Vitals:   03/18/22 1533  BP: 124/72  Pulse: 76  SpO2: 96%  Weight: 228 lb 12.8 oz (103.8 kg)   Height: 5\' 10"  (1.778 m)   Physical Exam Constitutional:      General: He is not in acute distress.    Appearance: He is obese.  HENT:     Head: Normocephalic and atraumatic.  Eyes:     Conjunctiva/sclera: Conjunctivae normal.     Pupils: Pupils are equal, round, and reactive to light.  Cardiovascular:     Rate and Rhythm: Normal rate and regular rhythm.     Pulses: Normal pulses.     Heart sounds: Normal heart sounds. No murmur heard. Pulmonary:     Effort: Pulmonary effort is normal. No respiratory distress.     Breath sounds: Normal breath sounds. No wheezing or rhonchi.  Musculoskeletal:     Right lower leg: No edema.     Left lower leg: No edema.  Lymphadenopathy:     Cervical: No cervical adenopathy.  Skin:    General: Skin is warm and dry.  Neurological:     General: No focal deficit present.     Mental Status: He is alert.  Psychiatric:        Mood and Affect: Mood normal.        Behavior: Behavior normal.        Thought Content: Thought content normal.        Judgment: Judgment normal.    CBC    Component Value Date/Time   WBC 9.1 11/06/2021 2052   RBC 5.21 11/06/2021 2052   HGB 15.7 11/06/2021 2052   HCT 46.4 11/06/2021 2052   PLT 324 11/06/2021 2052   MCV 89.1 11/06/2021 2052   MCH 30.1 11/06/2021 2052   MCHC 33.8 11/06/2021 2052   RDW 13.0 11/06/2021 2052   LYMPHSABS 2.9 11/06/2021 2052   MONOABS 0.9 11/06/2021 2052   EOSABS 0.2 11/06/2021 2052   BASOSABS 0.1 11/06/2021 2052      Latest Ref Rng & Units 11/06/2021    8:52 PM  BMP  Glucose 70 - 99 mg/dL 01/06/2022   BUN 6 - 20 mg/dL 17   Creatinine 347 - 1.24 mg/dL 4.25   Sodium 9.56 - 387 mmol/L 138   Potassium 3.5 - 5.1 mmol/L 4.1   Chloride 98 - 111 mmol/L 107   CO2 22 - 32 mmol/L 20   Calcium 8.9 - 10.3 mg/dL 8.8    Chest imaging: HRCT Chest 02/02/22 1. Minimal irregular peripheral interstitial opacity at the bilateral lung bases, most suggestive of minimal, bland sequelae of prior infection  or aspiration. No specific findings to suggest fibrotic interstitial lung disease.   2. Mild lobular air trapping on expiratory phase imaging, suggestive of small airways disease.   3.  Coronary artery disease.  CXR 11/06/21 The heart size and mediastinal contours are within normal limits. Both lungs are clear. The visualized skeletal structures are unremarkable.  PFT:  Latest Ref Rng & Units 03/16/2022    8:50 AM  PFT Results  FVC-Pre L 4.60   FVC-Predicted Pre % 92   FVC-Post L 4.08   FVC-Predicted Post % 81   Pre FEV1/FVC % % 84   Post FEV1/FCV % % 89   FEV1-Pre L 3.87   FEV1-Predicted Pre % 100   FEV1-Post L 3.62   DLCO uncorrected ml/min/mmHg 32.15   DLCO UNC% % 111   DLCO corrected ml/min/mmHg 32.15   DLCO COR %Predicted % 111   DLVA Predicted % 117   TLC L 6.12   TLC % Predicted % 87   RV % Predicted % 75     Labs:  Path:  Echo:  Heart Catheterization:  Assessment & Plan:   Mild intermittent reactive airway disease without complication - Plan: fluticasone (FLOVENT HFA) 110 MCG/ACT inhaler  Shortness of breath  Discussion: [name] is a 51 year old male, never smoker with severe obstructive sleep apnea, paroxysmal atrial fibrillation and obesity who returns to pulmonary clinic for shortness of breath.   His dyspnea appears related to weight gain and deconditioning.  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  HRCT chest also shows lobular air trapping concerning for possible small airways disease. We will trial him on flovent 2 puffs twice daily and monitor for symptom relief. He wanted to avoid SABA/LABA medications given his history of atrial fibrilation.   He has started CPAP therapy for his OSA.   Follow up in 6 months  Melody Comas, MD [facility] Office: [phone]   Current Outpatient  Medications:    eszopiclone (LUNESTA) 2 MG TABS tablet, Take by mouth. Take on weekends only, Disp: , Rfl:    famotidine (PEPCID) 20 MG tablet, , Disp: , Rfl:    fluticasone (FLOVENT HFA) 110 MCG/ACT inhaler, Inhale 2 puffs into the lungs 2 (two) times daily., Disp: 1 each, Rfl: 12   ibuprofen (ADVIL,MOTRIN) 200 MG tablet, Take 200 mg by mouth every 6 (six) hours as needed., Disp: , Rfl:    metoprolol succinate (TOPROL-XL) 25 MG 24 hr tablet, Take 0.5 tablets (12.5 mg total) by mouth daily., Disp: 45 tablet, Rfl: 3

## 2022-03-18 NOTE — Patient Instructions (Addendum)
Try flovent 2 puffs twice daily - rinse mouth out after each use  Your breathing tests are within normal limits  Your CT Chest scan show possible small airways disease consistent with reactive airways disease or asthma  Follow up in 6 months in person or via video visit

## 2022-03-21 ENCOUNTER — Encounter: Payer: Self-pay | Admitting: Pulmonary Disease

## 2022-03-22 DIAGNOSIS — G4733 Obstructive sleep apnea (adult) (pediatric): Secondary | ICD-10-CM | POA: Diagnosis not present

## 2022-03-23 ENCOUNTER — Other Ambulatory Visit (HOSPITAL_COMMUNITY): Payer: Self-pay

## 2022-03-23 ENCOUNTER — Encounter: Payer: Self-pay | Admitting: Pulmonary Disease

## 2022-03-23 NOTE — Telephone Encounter (Signed)
Mychart message sent by pt: [name] "[name]"  P [facility] (supporting Martina Sinner, MD) 3 hours ago (8:39 AM)   DN Dr. Francine Graven sent in a prescription for an inhaler to our pharmacy last Friday at the appointment and it required PA from insurance. We have not heard anything back from this and he was hoping to start this soon. Do we have an update on this or what is the status of the medication being approved? Thank you!    Looks like the inhaler that was prescribed was Flovent. Routing to prior auth team. Please advise.

## 2022-03-29 ENCOUNTER — Other Ambulatory Visit (HOSPITAL_COMMUNITY): Payer: Self-pay

## 2022-04-13 DIAGNOSIS — G4733 Obstructive sleep apnea (adult) (pediatric): Secondary | ICD-10-CM | POA: Diagnosis not present

## 2022-04-20 ENCOUNTER — Encounter: Payer: Self-pay | Admitting: Cardiology

## 2022-04-22 ENCOUNTER — Ambulatory Visit: Payer: BC Managed Care – PPO | Admitting: Physician Assistant

## 2022-04-26 DIAGNOSIS — G4733 Obstructive sleep apnea (adult) (pediatric): Secondary | ICD-10-CM | POA: Diagnosis not present

## 2022-04-27 ENCOUNTER — Ambulatory Visit: Payer: BC Managed Care – PPO | Admitting: Cardiovascular Disease

## 2022-04-27 ENCOUNTER — Encounter: Payer: Self-pay | Admitting: Cardiovascular Disease

## 2022-04-27 VITALS — BP 120/72 | HR 60 | Ht 70.0 in | Wt 226.0 lb

## 2022-04-27 DIAGNOSIS — E785 Hyperlipidemia, unspecified: Secondary | ICD-10-CM | POA: Diagnosis not present

## 2022-04-27 DIAGNOSIS — I480 Paroxysmal atrial fibrillation: Secondary | ICD-10-CM

## 2022-04-27 DIAGNOSIS — E669 Obesity, unspecified: Secondary | ICD-10-CM

## 2022-04-27 DIAGNOSIS — G4733 Obstructive sleep apnea (adult) (pediatric): Secondary | ICD-10-CM

## 2022-04-27 NOTE — Progress Notes (Signed)
Cardiology Office Note    Date:  04/30/2022   ID:  [name], DOB [birth year], MRN [id number]  PCP:  Curly Rim, MD  Cardiologist:  Shelva Majestic, MD (sleep); Dr. Berniece Salines  New sleep consultation referred by Dr. Berniece Salines   History of Present Illness:  [name] is a 51 y.o. male who has a history of asthma, GERD, hyperlipidemia, PSVT and paroxysmal atrial fibrillation.  He has had recent recurrent atrial fibrillation which had converted back to sinus rhythm.  Due to concerns for obstructive sleep apnea with symptoms of snoring, obesity, nonrestorative sleep, and nocturia 3 times per night in addition to his atrial fibrillation, he was referred for a home sleep study which was done on December 27, 2021.  He was found to have severe obstructive sleep apnea with an AHI of 33.7/h.  However, his events were very severe with supine sleep with an AHI of 76.5/h versus nonsupine sleep at 11.9/h.  He had moderate oxygen desaturation to a nadir of 83% and snored for 36.6 minutes of his evaluation.  Typically he goes to bed between 930 and 10 PM and wakes up at 5 AM during the workweek but on weekends he sleeps until 7:30-8:00. He initiated CPAP auto therapy with initial pressure settings at 7 to 18 cm of water on February 26, 2022.  [employer] is his DME company.  A download from June 24 through April 26, 2022 confirms excellent compliance with usage days at 100% and usage greater than 4 hours at 97%.  Average use was 7 hours and 14 minutes.  At his pressure range of 7 to 18 cm of water, AHI is 4.3.  His 95th percentile pressure is 10.3 with maximum average pressure 12.2.  Since initiating CPAP therapy.  His sleep is significantly improved.  He is unaware of any recurrent atrial fibrillation.  His sleep is now restorative.  Previously he was having nocturia 2-3 times per night and now most nights is 0 to an occasional 1 time per night.  He denies any residual daytime sleepiness and an Epworth  Sleepiness Scale score was calculated in the office today and this endorsed at 3 arguing against daytime sleepiness.  With therapy, he is unaware of any breakthrough snoring.  He denies any restless legs, bruxism, hypnopompic or hypnagogic hallucinations or cataplectic events.  He presents for his initial sleep consultation and evaluation.   Past Medical History:  Diagnosis Date   Abnormal involuntary movements(781.0)    Allergy    Asthma    had hx bronchitis-cough-better now   GERD (gastroesophageal reflux disease)    no meds now-cough gone   Hyperlipidemia    Lumbago    Nephrolithiasis     Past Surgical History:  Procedure Laterality Date   BACK SURGERY  2006   lumb lam   HERNIA REPAIR  05/16/2012   lih repair   INGUINAL HERNIA REPAIR  05/16/2012   Procedure: HERNIA REPAIR INGUINAL ADULT;  Surgeon: Harl Bowie, MD;  Location: [facility];  Service: General;  Laterality: Left;  inguinal area   TONSILLECTOMY     WISDOM TOOTH EXTRACTION  2005    Current Medications: Outpatient Medications Prior to Visit  Medication Sig Dispense Refill   eszopiclone (LUNESTA) 2 MG TABS tablet Take by mouth. Take on weekends only     famotidine (PEPCID) 20 MG tablet      fluticasone (FLOVENT HFA) 110 MCG/ACT inhaler Inhale 2 puffs into the lungs  2 (two) times daily. 1 each 12   ibuprofen (ADVIL,MOTRIN) 200 MG tablet Take 200 mg by mouth every 6 (six) hours as needed.     metoprolol succinate (TOPROL-XL) 25 MG 24 hr tablet Take 0.5 tablets (12.5 mg total) by mouth daily. 45 tablet 3   No facility-administered medications prior to visit.     Allergies:   Patient has no known allergies.   Social History   Socioeconomic History   Marital status: Married    Spouse name: Not on file   Number of children: Not on file   Years of education: Not on file   Highest education level: Not on file  Occupational History   Not on file  Tobacco Use   Smoking status: Never    Smokeless tobacco: Not on file  Substance and Sexual Activity   Alcohol use: Yes   Drug use: No   Sexual activity: Not on file  Other Topics Concern   Not on file  Social History Narrative   Not on file   Social Determinants of Health   Financial Resource Strain: Not on file  Food Insecurity: Not on file  Transportation Needs: Not on file  Physical Activity: Not on file  Stress: Not on file  Social Connections: Not on file    Socially he was born in [place] at [facility].  He is married for 25 years.  And has a child age 64.  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  He walks daily and typically walks 12-13,000 steps per day.  Family History: His father is 73 years old and has kidney issues.  Mother died and had Parkinson's disease.  He has 1 deceased brother who died with a heart attack.  He has 2 sisters.  ROS General: Negative; No fevers, chills, or night sweats;  HEENT: Negative; No changes in vision or hearing, sinus congestion, difficulty swallowing Pulmonary: Negative; No cough, wheezing, shortness of breath, hemoptysis Cardiovascular: Negative; No chest pain, presyncope, syncope, palpitations GI: Negative; No nausea, vomiting, diarrhea, or abdominal pain GU: Negative; No dysuria, hematuria, or difficulty voiding Musculoskeletal: Negative; no myalgias, joint pain, or weakness Hematologic/Oncology: Negative; no easy bruising, bleeding Endocrine: Negative; no heat/cold intolerance; no diabetes Neuro: Negative; no changes in balance, headaches Skin: Negative; No rashes or skin lesions Psychiatric: Negative; No behavioral problems, depression Sleep: See HPI.  Other comprehensive 14 point system review is negative.   PHYSICAL EXAM:   VS:  BP 120/72   Pulse 60   Ht '5\' 10"'  (1.778 m)   Wt 226 lb (102.5 kg)   SpO2 94%   BMI 32.43 kg/m     Repeat blood pressure by me was 118/68  Wt Readings from Last 3 Encounters:  04/27/22 226 lb  (102.5 kg)  03/18/22 228 lb 12.8 oz (103.8 kg)  03/11/22 229 lb (103.9 kg)    General: Alert, oriented, no distress.  Skin: normal turgor, no rashes, warm and dry HEENT: Normocephalic, atraumatic. Pupils equal round and reactive to light; sclera anicteric; extraocular muscles intact; Nose without nasal septal hypertrophy Mouth/Parynx benign; Mallinpatti scale 3 Neck: No JVD, no carotid bruits; normal carotid upstroke Lungs: clear to ausculatation and percussion; no wheezing or rales Chest wall: without tenderness to palpitation Heart: PMI not displaced, RRR, s1 s2 normal, 1/6 systolic murmur, no diastolic murmur, no rubs, gallops, thrills, or heaves Abdomen: soft, nontender; no hepatosplenomehaly, BS+; abdominal aorta nontender and not dilated by palpation. Back: no CVA tenderness  Pulses 2+ Musculoskeletal: full range of motion, normal strength, no joint deformities Extremities: no clubbing cyanosis or edema, Homan's sign negative  Neurologic: grossly nonfocal; Cranial nerves grossly wnl Psychologic: Normal mood and affect   Studies/Labs Reviewed:   April 27, 2022 ECG (independently read by me): NSR at 60, no ectopy, normal intervals  Recent Labs:    Latest Ref Rng & Units 11/06/2021    8:52 PM  BMP  Glucose 70 - 99 mg/dL 103   BUN 6 - 20 mg/dL 17   Creatinine 0.61 - 1.24 mg/dL 0.93   Sodium 135 - 145 mmol/L 138   Potassium 3.5 - 5.1 mmol/L 4.1   Chloride 98 - 111 mmol/L 107   CO2 22 - 32 mmol/L 20   Calcium 8.9 - 10.3 mg/dL 8.8         Latest Ref Rng & Units 11/06/2021    8:52 PM  Hepatic Function  Total Protein 6.5 - 8.1 g/dL 6.7   Albumin 3.5 - 5.0 g/dL 4.0   AST 15 - 41 U/L 34   ALT 0 - 44 U/L 25   Alk Phosphatase 38 - 126 U/L 58   Total Bilirubin 0.3 - 1.2 mg/dL 0.9        Latest Ref Rng & Units 11/06/2021    8:52 PM 05/16/2012    1:54 PM  CBC  WBC 4.0 - 10.5 K/uL 9.1    Hemoglobin 13.0 - 17.0 g/dL 15.7  13.3   Hematocrit 39.0 - 52.0 % 46.4    Platelets  150 - 400 K/uL 324     Lab Results  Component Value Date   MCV 89.1 11/06/2021   No results found for: "TSH" No results found for: "HGBA1C"   BNP No results found for: "BNP"  ProBNP No results found for: "PROBNP"   Lipid Panel  No results found for: "CHOL", "TRIG", "HDL", "CHOLHDL", "VLDL", "LDLCALC", "LDLDIRECT", "LABVLDL"   RADIOLOGY: No results found.   Additional studies/ records that were reviewed today include:      Patient Name: Quantavious, Eggert Date: 12/27/2021 Gender: Male D.O.B: May 17, 1971 Age (years): 82 Referring Provider: Godfrey Pick Tobb DO Height (inches): 71 Interpreting Physician: Shelva Majestic MD, ABSM Weight (lbs): 225 RPSGT: Jacolyn Reedy BMI: 31 MRN: [id number] Neck Size: 17.50   CLINICAL INFORMATION Sleep Study Type: HST   Indication for sleep study: Snoring, PAF, obesity   Epworth Sleepiness Score: 8   SLEEP STUDY TECHNIQUE A multi-channel overnight portable sleep study was performed. The channels recorded were: nasal airflow, thoracic respiratory movement, and oxygen saturation with a pulse oximetry. Snoring was also monitored.   MEDICATIONS famotidine (PEPCID) 20 MG tablet ibuprofen (ADVIL,MOTRIN) 200 MG tablet metoprolol succinate (TOPROL-XL) 25 MG 24 hr tablet  Patient self administered medications include: N/A.   SLEEP ARCHITECTURE Patient was studied for 411.3 minutes. The sleep efficiency was 100.0 % and the patient was supine for 33.8%. The arousal index was 0.0 per hour.   RESPIRATORY PARAMETERS The overall AHI was 33.7 per hour, with a central apnea index of 0 per hour. There is a significant positional component with supine sleep AHI 76.5/h versus non-supine sleep AHI 11.9/h.   The oxygen nadir was 83% during sleep.   CARDIAC DATA Mean heart rate during sleep was 62.0 bpm. HR range : 50 - 86 bpm.   IMPRESSIONS - Severe obstructive sleep apnea occurred during this study (AHI 33.7/h). - Moderate oxygen  desaturation to a nadir of 83%. Time < 89% was 10.5 minutes. -  Patient snored for 36.6 minutes (8.9%) during the sleep.   DIAGNOSIS - Obstructive Sleep Apnea (G47.33) - Nocturnal Hypoxemia (G47.36)   RECOMMENDATIONS - In this patient with cardiovascular comorbidities recommend therapeutic CPAP for treatment of severe sleep disordered breathing. If unable to schedule an in-lab titration, intiate Auto - PAP with EPR of 3 at 7 - 18 cm of water. - Effort should be made to optimize nasal and oropharyngeal patency. - Positional therapy avoiding supine position during sleep. - Avoid alcohol, sedatives and other CNS depressants that may worsen sleep apnea and disrupt normal sleep architecture. - Sleep hygiene should be reviewed to assess factors that may improve sleep quality. - Weight management (BMI 31) and regular exercise should be initiated or continued. - Recommend a download and sleep clinic evaluation after one month of therapy.    ASSESSMENT:    1. Obstructive sleep apnea (adult) (pediatric)   2. PAF (paroxysmal atrial fibrillation) ([place])   3. Hyperlipidemia, unspecified hyperlipidemia type   4. Mild obesity     PLAN:  Mr. Javante "Jonni Sanger" Cordaro is a 51 year old gentleman who has a history of asthma, GERD, hyperlipidemia, as well as PSVT and PAF.  An echo Doppler study has shown normal LV function.  He had developed recurrent atrial fibrillation requiring medication adjustment.  Due to concerns for obstructive sleep apnea with symptoms of snoring, obesity, atrial fibrillation, nonrestorative sleep, and frequent nocturia, he underwent an initial home sleep evaluation and was found to have severe sleep apnea.  His overall AHI was 33.7/h but in the supine position sleep apnea was very severe with an AHI of 76.5/h.  I had a long discussion with him today.  I reviewed his sleep study in detail.  I reviewed normal sleep architecture and potential disruptive sleep contributed by untreated sleep  apnea.  I discussed implications of untreated sleep apnea with reference to blood pressure control, potential for nocturnal arrhythmias, and significant increased incidence of development of atrial fibrillation.  In addition I discussed the significant increased risk for recurrent A-fib following successful cardioversion back to sinus rhythm if sleep apnea is untreated.  In addition I discussed its effect on increasing insulin resistance, increasing inflammatory markers, as well as effects on GERD.  I also discussed potential nocturnal hypoxemia contributing to nocturnal ischemia if underlying atherosclerosis is present both cardiac or cerebrovascular.  I discussed optimal sleep duration at 7 and 9 hours in an adult.  Previously he was experiencing nocturia at least 3 times per night and this has significantly improved with initiation of CPAP therapy.  I reviewed the pathophysiology associated with increased nocturnal urination with untreated sleep apnea.  Presently, he is doing well since initiation of treatment.  His compliance is excellent with 100% use.  Because of his work, he is sleeping less during workdays but is able to sleep longer hours over the weekends.  At times he takes melatonin during the week and has a prescription for Lunesta 2 mg which at times he may take on weekends.  He has been using a ResMed F30i mask.  There is no significant leak on his downloads.  His 95th percentile pressure is 10.3 with maximum average pressure at 12.2.  Clinically he is doing well.  I answered all his questions.  He now has a significantly improved understanding of the importance of continued therapy.  I will see him in 1 year for reevaluation or sooner as needed.  Medication Adjustments/Labs and Tests Ordered: Current medicines are reviewed at length with the  patient today.  Concerns regarding medicines are outlined above.  Medication changes, Labs and Tests ordered today are listed in the Patient Instructions  below. Patient Instructions  Medication Instructions:  Your physician recommends that you continue on your current medications as directed. Please refer to the Current Medication list given to you today.  *If you need a refill on your cardiac medications before your next appointment, please call your pharmacy*   Follow-Up: At [facility], you and your health needs are our priority.  As part of our continuing mission to provide you with exceptional heart care, we have created designated Provider Care Teams.  These Care Teams include your primary Cardiologist (physician) and Advanced Practice Providers (APPs -  Physician Assistants and Nurse Practitioners) who all work together to provide you with the care you need, when you need it.  We recommend signing up for the patient portal called "MyChart".  Sign up information is provided on this After Visit Summary.  MyChart is used to connect with patients for Virtual Visits (Telemedicine).  Patients are able to view lab/test results, encounter notes, upcoming appointments, etc.  Non-urgent messages can be sent to your provider as well.   To learn more about what you can do with MyChart, go to [email or web address].    Your next appointment:   12 month(s)  The format for your next appointment:   In Person  Provider:   Shelva Majestic, MD   Other Instructions Sleep Clinic   Signed, Shelva Majestic, MD, [facility], [place], American Board of Sleep Medicine  04/30/2022 5:17 PM    [facility] [address] [place], [place], [place]  [address] Phone: [phone]

## 2022-04-27 NOTE — Patient Instructions (Signed)

## 2022-04-30 ENCOUNTER — Encounter: Payer: Self-pay | Admitting: Cardiovascular Disease

## 2022-05-05 DIAGNOSIS — G4733 Obstructive sleep apnea (adult) (pediatric): Secondary | ICD-10-CM | POA: Diagnosis not present

## 2022-05-14 DIAGNOSIS — G4733 Obstructive sleep apnea (adult) (pediatric): Secondary | ICD-10-CM | POA: Diagnosis not present

## 2022-06-26 DIAGNOSIS — G4733 Obstructive sleep apnea (adult) (pediatric): Secondary | ICD-10-CM | POA: Diagnosis not present

## 2022-07-29 DIAGNOSIS — S61211A Laceration without foreign body of left index finger without damage to nail, initial encounter: Secondary | ICD-10-CM | POA: Diagnosis not present

## 2022-09-06 ENCOUNTER — Encounter: Payer: Self-pay | Admitting: Physician Assistant

## 2023-01-24 ENCOUNTER — Encounter: Payer: Self-pay | Admitting: Physician Assistant

## 2023-01-24 ENCOUNTER — Ambulatory Visit: Payer: BC Managed Care – PPO | Attending: Physician Assistant | Admitting: Physician Assistant

## 2023-01-24 ENCOUNTER — Ambulatory Visit: Payer: BC Managed Care – PPO | Admitting: Physician Assistant

## 2023-01-24 VITALS — Ht 70.0 in

## 2023-01-24 DIAGNOSIS — I480 Paroxysmal atrial fibrillation: Secondary | ICD-10-CM | POA: Diagnosis not present

## 2023-01-24 NOTE — Patient Instructions (Signed)
Medication Instructions:  The current medical regimen is effective;  continue present plan and medications.  If needed due to AFIB reoccurrence, you may take an extra 0.5 tablet of Metoprolol.   *If you need a refill on your cardiac medications before your next appointment, please call your pharmacy*   Follow-Up: At [facility], you and your health needs are our priority.  As part of our continuing mission to provide you with exceptional heart care, we have created designated Provider Care Teams.  These Care Teams include your primary Cardiologist (physician) and Advanced Practice Providers (APPs -  Physician Assistants and Nurse Practitioners) who all work together to provide you with the care you need, when you need it.  We recommend signing up for the patient portal called "MyChart".  Sign up information is provided on this After Visit Summary.  MyChart is used to connect with patients for Virtual Visits (Telemedicine).  Patients are able to view lab/test results, encounter notes, upcoming appointments, etc.  Non-urgent messages can be sent to your provider as well.   To learn more about what you can do with MyChart, go to [email or web address].    Your next appointment:   6 month(s)  Provider:   Thomasene Ripple, DO   (in person)

## 2023-01-24 NOTE — Progress Notes (Unsigned)
Virtual Visit via Video Note   Because of [name]'s co-morbid illnesses, he is at least at moderate risk for complications without adequate follow up.  This format is felt to be most appropriate for this patient at this time.  All issues noted in this document were discussed and addressed.  A limited physical exam was performed with this format.  Please refer to the patient's chart for his consent to telehealth for [facility].  Video Connection Lost Video connection was lost at > 50% of the duration of this visit, at which time the remainder of the visit was completed via audio only.      Date:  01/24/2023   ID:  [name], DOB [birth year], MRN [id number] The patient was identified using 2 identifiers.  Patient Location: Home Provider Location: Office/Clinic   PCP:  Vivien Presto, MD   [facility] Providers Cardiologist:  Thomasene Ripple, DO     Evaluation Performed:  Follow-Up Visit  Chief Complaint:  follow up  History of Present Illness:    [name] is a 52 y.o. male with  a hx of asthma, GERD, HLD, PSVT and paroxysmal atrial fibrillation.  Patient was first seen in March 2023 after being diagnosed with atrial fibrillation.  He was intolerant of diltiazem and was later switched to metoprolol succinate for rate control.  Echocardiogram obtained on 11/19/2021 showed EF 60 to 65%, mild AI.  Heart monitor for 14 days demonstrated minimal heart rate 44 bpm, maximum heart rate 169 bpm, average heart rate 72 bpm, predominant rhythm was sinus rhythm 1 9 SVT, fastest interval 12 beats at a maximal heart rate of 169 bpm.  PVC burden less than 1%.  No A-fib was noted. He was not started on anticoagulation given CHA2DS2-VASc score of 1.  Patient was last seen by Dr. Servando Salina in April 2023, he was having some shortness of breath associated with history of COVID and asthma.  He was referred to pulmonology service.  High-resolution CT obtained on 02/02/2022 showed  minimally irregular perihilar interstitial opacity in bilateral lung bases most suggestive of sequela of previous infection or aspiration, small airway disease was noted with coronary artery disease as well.   Patient presents today for video visit, however even though I can see and hear him, he was only able to hear me.  I was unable to get my video camera to work.  According to [name], he has been having some brief recurrence of atrial fibrillation.  He has 100% cardiac awareness of A-fib.  He knows exactly when his heart go in or out of A-fib.  His wife sent [place] a list of the last few recurrence of A-fib.  In October and December 2023, he spent 19-hour and a 16-hour respectively in atrial fibrillation.  All other A-fib has been very brief.  More recently, he has been having occasional A-fib that will only last about 2 to 5 minutes.  He is aware to take extra 1/2 dose of metoprolol succinate as needed for breakthrough A-fib.  If he is still in A-fib after 2 hours, he may can take additional extra 1/2 dose of metoprolol succinate to try to bring him out of A-fib.  Unfortunately without a coronary CT, he likely will not qualify for pill in the pocket flecainide due to history of coronary artery atherosclerosis seen on previous CT scan image.  If he does end up in the emergency room in the future because  of A-fib, we can try the pill in the pocket strategy in a controlled setting.  Overall, he is doing well without chest pain or worsening dyspnea.  He can follow-up with Dr. Servando Salina in 6 months.   Past Medical History:  Diagnosis Date   Abnormal involuntary movements(781.0)    Allergy    Asthma    had hx bronchitis-cough-better now   GERD (gastroesophageal reflux disease)    no meds now-cough gone   Hyperlipidemia    Lumbago    Nephrolithiasis    Past Surgical History:  Procedure Laterality Date   BACK SURGERY  2006   lumb lam   HERNIA REPAIR  05/16/2012   lih repair   INGUINAL HERNIA  REPAIR  05/16/2012   Procedure: HERNIA REPAIR INGUINAL ADULT;  Surgeon: Shelly Rubenstein, MD;  Location: [facility];  Service: General;  Laterality: Left;  inguinal area   TONSILLECTOMY     WISDOM TOOTH EXTRACTION  2005     No outpatient medications have been marked as taking for the 01/24/23 encounter (Appointment) with Azalee Course, PA.     Allergies:   Patient has no known allergies.   Social History   Tobacco Use   Smoking status: Never  Substance Use Topics   Alcohol use: Yes   Drug use: No     Family Hx: The patient's family history is not on file.  ROS:   Please see the history of present illness.     All other systems reviewed and are negative.   Prior CV studies:   The following studies were reviewed today:  Echo 11/19/2021 1. Left ventricular ejection fraction, by estimation, is 60 to 65%. The  left ventricle has normal function. The left ventricle has no regional  wall motion abnormalities. Left ventricular diastolic parameters were  normal.   2. Right ventricular systolic function is normal. The right ventricular  size is moderately enlarged. Tricuspid regurgitation signal is inadequate  for assessing PA pressure.   3. The mitral valve is normal in structure. No evidence of mitral valve  regurgitation. No evidence of mitral stenosis.   4. The aortic valve is tricuspid. Aortic valve regurgitation is mild.  Aortic valve sclerosis is present, with no evidence of aortic valve  stenosis.   5. The inferior vena cava is normal in size with greater than 50%  respiratory variability, suggesting right atrial pressure of 3 mmHg.     Labs/Other Tests and Data Reviewed:    EKG:  An ECG dated 04/27/2022 was personally reviewed today and demonstrated:  NSR without significant ST-T wave changes  Recent Labs: No results found for requested labs within last 365 days.   Recent Lipid Panel No results found for: "CHOL", "TRIG", "HDL", "CHOLHDL", "LDLCALC",  "LDLDIRECT"  Wt Readings from Last 3 Encounters:  04/27/22 226 lb (102.5 kg)  03/18/22 228 lb 12.8 oz (103.8 kg)  03/11/22 229 lb (103.9 kg)     Risk Assessment/Calculations:    CHA2DS2-VASc Score = 1   This indicates a 0.6% annual risk of stroke. The patient's score is based upon: CHF History: 0 HTN History: 0 Diabetes History: 0 Stroke History: 0 Vascular Disease History: 1 Age Score: 0 Gender Score: 0          Objective:    Vital Signs:  There were no vitals taken for this visit.   VITAL SIGNS:  reviewed  ASSESSMENT & PLAN:    PAF: Since last year, patient has a few recurrence of  atrial fibrillation.  The longest was 19 hours.  More recently, the recurrence of atrial fibrillation tend to only last 2 to 5 minutes each time.  He had has very good cardiac awareness of A-fib.  He is not on anticoagulation therapy due to low CHA2DS2-VASc score.  He is aware to take additional dose of metoprolol on a as needed basis for breakthrough atrial fibrillation.            Time:   Today, I have spent 23 minutes with the patient with telehealth technology discussing the above problems.     Medication Adjustments/Labs and Tests Ordered: Current medicines are reviewed at length with the patient today.  Concerns regarding medicines are outlined above.   Tests Ordered: No orders of the defined types were placed in this encounter.   Medication Changes: No orders of the defined types were placed in this encounter.   Follow Up:  In Person in 6 month(s)  Signed, Azalee Course, [place]  01/24/2023 8:04 AM    [facility]

## 2023-01-26 NOTE — Telephone Encounter (Signed)
Spoke with patient wife. Patient currently in Afib with HR at 70's-80's which is what it has been through the night.  States he is feeling fine other than can tell when he is in and out of Afib.   He went to Gi appt. and then work. He is just tired due to being in the Afib. He ask should he continue to take the metoprolol succinate until out of Afib.  I advise to hold on the medication until discuss with doctor as he has had 3 doses up to this point.   Please advise  She states that the flare ups are from eating or drinking something really cold.  That was reason for flare up last night.  This is the reason he went to GI today , to schedule his endoscopy, scheduled June 7th.

## 2023-01-26 NOTE — Telephone Encounter (Signed)
Hi. I discussed the case with Dr. Servando Salina. Let's go ahead and permanently increase [name]'s metoprolol succinate to 25mg  daily, with option of taking a additional whole tablet as needed. Let's bring [name] back as a in-office visit in the next few days with a APP to review option. He will need a EKG as well.

## 2023-03-19 ENCOUNTER — Other Ambulatory Visit: Payer: Self-pay | Admitting: Physician Assistant

## 2023-07-27 ENCOUNTER — Encounter: Payer: Self-pay | Admitting: Cardiology

## 2023-07-28 ENCOUNTER — Ambulatory Visit (INDEPENDENT_AMBULATORY_CARE_PROVIDER_SITE_OTHER): Payer: BC Managed Care – PPO

## 2023-07-28 ENCOUNTER — Encounter: Payer: Self-pay | Admitting: Cardiology

## 2023-07-28 ENCOUNTER — Ambulatory Visit: Payer: BC Managed Care – PPO | Attending: Cardiology | Admitting: Cardiology

## 2023-07-28 VITALS — BP 112/80 | HR 61 | Ht 71.0 in | Wt 230.4 lb

## 2023-07-28 DIAGNOSIS — I480 Paroxysmal atrial fibrillation: Secondary | ICD-10-CM | POA: Diagnosis not present

## 2023-07-28 DIAGNOSIS — I4891 Unspecified atrial fibrillation: Secondary | ICD-10-CM

## 2023-07-28 DIAGNOSIS — G4733 Obstructive sleep apnea (adult) (pediatric): Secondary | ICD-10-CM

## 2023-07-28 NOTE — Progress Notes (Signed)
Cardiology Office Note:    Date:  07/28/2023   ID:  [name], DOB 04-01-1971, MRN [id number]  PCP:  Vivien Presto, MD  Cardiologist:  Thomasene Ripple, DO  Electrophysiologist:  None   Referring MD: Vivien Presto, MD   " I am doing fine"  History of Present Illness:    [name] is a 52 y.o. male with a hx of asthma, GERD, OSA on CPAP, hyperlipidemia paroxysmal SVT and paroxysmal atrial fibrillation here today for follow-up visit.  At the first visit he had just been diagnosed with atrial fibrillation we discussed his diagnosis as well as started the patient on low-dose Cardizem as he was also hypertensive.  In the interim he had not been able to tolerate the Cardizem we will transition the patient to metoprolol.    His last visit with me was in April 2023 at that time he had not had any atrial fibrillation episodes.  Kept him on the metoprolol.  CHA2DS2-VASc score was still 1.  Since that time there have been sequence of events including discussion from the patient to transition his care but did did not follow through with this.  He has been seen in our office by [place], his most recent visit was in May 2024 during that time they reported that he had had atrial fibrillation episodes which lasted up to 19 hours.  Held discussed with him on taking increased dose of metoprolol on a as needed basis for breakthrough atrial fibrillation.  Since his visit with how he has had 2 episodes.  He is here today with his wife.  He reports that he thinks that get a ray may be a trigger here for him.  He also tells me that when he sleeps through a straw there is also leads to some issues.  He has not passed out.  He denies any lightheaded dizziness or any chest pain.  Past Medical History:  Diagnosis Date   Abnormal involuntary movements(781.0)    Allergy    Asthma    had hx bronchitis-cough-better now   GERD (gastroesophageal reflux disease)    no meds now-cough gone    Hyperlipidemia    Lumbago    Nephrolithiasis     Past Surgical History:  Procedure Laterality Date   BACK SURGERY  2006   lumb lam   HERNIA REPAIR  05/16/2012   lih repair   INGUINAL HERNIA REPAIR  05/16/2012   Procedure: HERNIA REPAIR INGUINAL ADULT;  Surgeon: Shelly Rubenstein, MD;  Location: [facility];  Service: General;  Laterality: Left;  inguinal area   TONSILLECTOMY     WISDOM TOOTH EXTRACTION  2005    Current Medications: Current Meds  Medication Sig   famotidine (PEPCID) 20 MG tablet    metoprolol succinate (TOPROL-XL) 25 MG 24 hr tablet TAKE 1 TABLET (25 MG TOTAL) BY MOUTH DAILY.   rosuvastatin (CRESTOR) 10 MG tablet Take 10 mg by mouth daily.     Allergies:   Patient has no known allergies.   Social History   Socioeconomic History   Marital status: Married    Spouse name: Not on file   Number of children: Not on file   Years of education: Not on file   Highest education level: Not on file  Occupational History   Not on file  Tobacco Use   Smoking status: Never   Smokeless tobacco: Not on file  Substance and Sexual Activity   Alcohol use: Yes  Drug use: No   Sexual activity: Not on file  Other Topics Concern   Not on file  Social History Narrative   Not on file   Social Determinants of Health   Financial Resource Strain: Patient Declined (01/09/2023)   Received from [facility], [facility]   Overall Financial Resource Strain (CARDIA)    Difficulty of Paying Living Expenses: Patient declined  Food Insecurity: Patient Declined (01/09/2023)   Received from [facility], [facility]   Hunger Vital Sign    Worried About Running Out of Food in the Last Year: Patient declined    Ran Out of Food in the Last Year: Patient declined  Transportation Needs: Patient Declined (01/09/2023)   Received from [facility], [facility]   [facility] - Transportation    Lack of Transportation (Medical): Patient declined    Lack of Transportation  (Non-Medical): Patient declined  Physical Activity: Sufficiently Active (01/09/2023)   Received from [facility], [facility]   Exercise Vital Sign    Days of Exercise per Week: 5 days    Minutes of Exercise per Session: 150+ min  Stress: No Stress Concern Present (01/09/2023)   Received from [facility], [facility] - Occupational Stress Questionnaire    Feeling of Stress : Not at all  Social Connections: Socially Integrated (01/09/2023)   Received from [facility]   Social Network    How would you rate your social network (family, work, friends)?: Good participation with social networks     Family History: The patient's family history is not on file.  ROS:   Review of Systems  Constitution: Negative for decreased appetite, fever and weight gain.  HENT: Negative for congestion, ear discharge, hoarse voice and sore throat.   Eyes: Negative for discharge, redness, vision loss in right eye and visual halos.  Cardiovascular: Negative for chest pain, dyspnea on exertion, leg swelling, orthopnea and palpitations.  Respiratory: Negative for cough, hemoptysis, shortness of breath and snoring.   Endocrine: Negative for heat intolerance and polyphagia.  Hematologic/Lymphatic: Negative for bleeding problem. Does not bruise/bleed easily.  Skin: Negative for flushing, nail changes, rash and suspicious lesions.  Musculoskeletal: Negative for arthritis, joint pain, muscle cramps, myalgias, neck pain and stiffness.  Gastrointestinal: Negative for abdominal pain, bowel incontinence, diarrhea and excessive appetite.  Genitourinary: Negative for decreased libido, genital sores and incomplete emptying.  Neurological: Negative for brief paralysis, focal weakness, headaches and loss of balance.  Psychiatric/Behavioral: Negative for altered mental status, depression and suicidal ideas.  Allergic/Immunologic: Negative for HIV exposure and  persistent infections.    EKGs/Labs/Other Studies Reviewed:    The following studies were reviewed today:   EKG:  None today     Recent Labs: No results found for requested labs within last 365 days.  Recent Lipid Panel No results found for: "CHOL", "TRIG", "HDL", "CHOLHDL", "VLDL", "LDLCALC", "LDLDIRECT"  Physical Exam:    VS:  BP 112/80 (BP Location: Right Arm, Patient Position: Sitting, Cuff Size: Normal)   Pulse 61   Ht 5\' 11"  (1.803 m)   Wt 230 lb 6.4 oz (104.5 kg)   SpO2 93%   BMI 32.13 kg/m     Wt Readings from Last 3 Encounters:  07/28/23 230 lb 6.4 oz (104.5 kg)  04/27/22 226 lb (102.5 kg)  03/18/22 228 lb 12.8 oz (103.8 kg)     GEN: Well nourished, well developed in no acute distress HEENT: Normal NECK: No JVD; No carotid  bruits LYMPHATICS: No lymphadenopathy CARDIAC: S1S2 noted,RRR, no murmurs, rubs, gallops RESPIRATORY:  Clear to auscultation without rales, wheezing or rhonchi  ABDOMEN: Soft, non-tender, non-distended, +bowel sounds, no guarding. EXTREMITIES: No edema, No cyanosis, no clubbing MUSCULOSKELETAL:  No deformity  SKIN: Warm and dry NEUROLOGIC:  Alert and oriented x 3, non-focal PSYCHIATRIC:  Normal affect, good insight  ASSESSMENT:    1. Atrial fibrillation, unspecified type (HCC)   2. PAF (paroxysmal atrial fibrillation) (HCC)   3. OSA (obstructive sleep apnea)    PLAN:     He is in sinus rhythm today, but he still is having burst of atrial fibrillation.,  I like to place a monitor on the patient to understand his A-fib burden.  I think rhythm strategy will be a good idea here where I can likely start flecainide for this patient to help with rhythm control.  Chest Vascor remains to be 1.  The patient understands the need to lose weight with diet and exercise. We have discussed specific strategies for this.  The patient is in agreement with the above plan. The patient left the office in stable condition.  The patient will follow up in  6 months or sooner if needed.   Medication Adjustments/Labs and Tests Ordered: Current medicines are reviewed at length with the patient today.  Concerns regarding medicines are outlined above.  Orders Placed This Encounter  Procedures   LONG TERM MONITOR (3-14 DAYS)   EKG 12-Lead   No orders of the defined types were placed in this encounter.   Patient Instructions  Medication Instructions:  Your physician recommends that you continue on your current medications as directed. Please refer to the Current Medication list given to you today.  *If you need a refill on your cardiac medications before your next appointment, please call your pharmacy*    Testing/Procedures: ZIO XT- Long Term Monitor Instructions  Your physician has requested you wear a ZIO patch monitor for 14 days.  This is a single patch monitor. [employer] supplies one patch monitor per enrollment. Additional stickers are not available. Please do not apply patch if you will be having a Nuclear Stress Test,  Echocardiogram, Cardiac CT, MRI, or Chest Xray during the period you would be wearing the  monitor. The patch cannot be worn during these tests. You cannot remove and re-apply the  ZIO XT patch monitor.  Your ZIO patch monitor will be mailed 3 day USPS to your address on file. It may take 3-5 days  to receive your monitor after you have been enrolled.  Once you have received your monitor, please review the enclosed instructions. Your monitor  has already been registered assigning a specific monitor serial # to you.  Billing and Patient Assistance Program Information  We have supplied [employer] with any of your insurance information on file for billing purposes. [employer] offers a sliding scale Patient Assistance Program for patients that do not have  insurance, or whose insurance does not completely cover the cost of the ZIO monitor.  You must apply for the Patient Assistance Program to qualify for this discounted  rate.  To apply, please call [employer] at [phone], select option 4, select option 2, ask to apply for  Patient Assistance Program. Meredeth Ide will ask your household income, and how many people  are in your household. They will quote your out-of-pocket cost based on that information.  [employer] will also be able to set up a 41-month, interest-free payment plan if needed.  Applying the monitor  Shave hair from upper left chest.  Hold abrader disc by orange tab. Rub abrader in 40 strokes over the upper left chest as  indicated in your monitor instructions.  Clean area with 4 enclosed alcohol pads. Let dry.  Apply patch as indicated in monitor instructions. Patch will be placed under collarbone on left  side of chest with arrow pointing upward.  Rub patch adhesive wings for 2 minutes. Remove white label marked "1". Remove the white  label marked "2". Rub patch adhesive wings for 2 additional minutes.  While looking in a mirror, press and release button in center of patch. A small green light will  flash 3-4 times. This will be your only indicator that the monitor has been turned on.  Do not shower for the first 24 hours. You may shower after the first 24 hours.  Press the button if you feel a symptom. You will hear a small click. Record Date, Time and  Symptom in the Patient Logbook.  When you are ready to remove the patch, follow instructions on the last 2 pages of Patient  Logbook. Stick patch monitor onto the last page of Patient Logbook.  Place Patient Logbook in the blue and white box. Use locking tab on box and tape box closed  securely. The blue and white box has prepaid postage on it. Please place it in the mailbox as  soon as possible. Your physician should have your test results approximately 7 days after the  monitor has been mailed back to [facility].  Call [facility] Customer Care at [phone] if you have questions regarding  your ZIO XT patch monitor. Call them  immediately if you see an orange light blinking on your  monitor.  If your monitor falls off in less than 4 days, contact our Monitor department at [phone].  If your monitor becomes loose or falls off after 4 days call [employer] at [phone] for  suggestions on securing your monitor    Follow-Up: At [facility], you and your health needs are our priority.  As part of our continuing mission to provide you with exceptional heart care, we have created designated Provider Care Teams.  These Care Teams include your primary Cardiologist (physician) and Advanced Practice Providers (APPs -  Physician Assistants and Nurse Practitioners) who all work together to provide you with the care you need, when you need it.   Your next appointment:   9 month(s)  Provider:   Thomasene Ripple, DO     Adopting a Healthy Lifestyle.  Know what a healthy weight is for you (roughly BMI <25) and aim to maintain this   Aim for 7+ servings of fruits and vegetables daily   65-80+ fluid ounces of water or unsweet tea for healthy kidneys   Limit to max 1 drink of alcohol per day; avoid smoking/tobacco   Limit animal fats in diet for cholesterol and heart health - choose grass fed whenever available   Avoid highly processed foods, and foods high in saturated/trans fats   Aim for low stress - take time to unwind and care for your mental health   Aim for 150 min of moderate intensity exercise weekly for heart health, and weights twice weekly for bone health   Aim for 7-9 hours of sleep daily   When it comes to diets, agreement about the perfect plan isnt easy to find, even among the experts. Experts at the [employer] developed an idea known as the Healthy  Eating Plate. Just imagine a plate divided into logical, healthy portions.   The emphasis is on diet quality:   Load up on vegetables and fruits - one-half of your plate: Aim for color and variety, and remember that  potatoes dont count.   Go for whole grains - one-quarter of your plate: Whole wheat, barley, wheat berries, quinoa, oats, brown rice, and foods made with them. If you want pasta, go with whole wheat pasta.   Protein power - one-quarter of your plate: Fish, chicken, beans, and nuts are all healthy, versatile protein sources. Limit red meat.   The diet, however, does go beyond the plate, offering a few other suggestions.   Use healthy plant oils, such as olive, canola, soy, corn, sunflower and peanut. Check the labels, and avoid partially hydrogenated oil, which have unhealthy trans fats.   If youre thirsty, drink water. Coffee and tea are good in moderation, but skip sugary drinks and limit milk and dairy products to one or two daily servings.   The type of carbohydrate in the diet is more important than the amount. Some sources of carbohydrates, such as vegetables, fruits, whole grains, and beans-are healthier than others.   Finally, stay active  Signed, Thomasene Ripple, DO  07/28/2023 8:28 AM    [facility]

## 2023-07-28 NOTE — Progress Notes (Unsigned)
Enrolled patient for a 14 day Zio XT  monitor to be mailed to patients home  °

## 2023-07-28 NOTE — Patient Instructions (Signed)
Medication Instructions:  Your physician recommends that you continue on your current medications as directed. Please refer to the Current Medication list given to you today.  *If you need a refill on your cardiac medications before your next appointment, please call your pharmacy*    Testing/Procedures: ZIO XT- Long Term Monitor Instructions  Your physician has requested you wear a ZIO patch monitor for 14 days.  This is a single patch monitor. [employer] supplies one patch monitor per enrollment. Additional stickers are not available. Please do not apply patch if you will be having a Nuclear Stress Test,  Echocardiogram, Cardiac CT, MRI, or Chest Xray during the period you would be wearing the  monitor. The patch cannot be worn during these tests. You cannot remove and re-apply the  ZIO XT patch monitor.  Your ZIO patch monitor will be mailed 3 day USPS to your address on file. It may take 3-5 days  to receive your monitor after you have been enrolled.  Once you have received your monitor, please review the enclosed instructions. Your monitor  has already been registered assigning a specific monitor serial # to you.  Billing and Patient Assistance Program Information  We have supplied [employer] with any of your insurance information on file for billing purposes. [employer] offers a sliding scale Patient Assistance Program for patients that do not have  insurance, or whose insurance does not completely cover the cost of the ZIO monitor.  You must apply for the Patient Assistance Program to qualify for this discounted rate.  To apply, please call [employer] at [phone], select option 4, select option 2, ask to apply for  Patient Assistance Program. [name] will ask your household income, and how many people  are in your household. They will quote your out-of-pocket cost based on that information.  [employer] will also be able to set up a 24-month, interest-free payment plan if needed.  Applying  the monitor   Shave hair from upper left chest.  Hold abrader disc by orange tab. Rub abrader in 40 strokes over the upper left chest as  indicated in your monitor instructions.  Clean area with 4 enclosed alcohol pads. Let dry.  Apply patch as indicated in monitor instructions. Patch will be placed under collarbone on left  side of chest with arrow pointing upward.  Rub patch adhesive wings for 2 minutes. Remove white label marked "1". Remove the white  label marked "2". Rub patch adhesive wings for 2 additional minutes.  While looking in a mirror, press and release button in center of patch. A small green light will  flash 3-4 times. This will be your only indicator that the monitor has been turned on.  Do not shower for the first 24 hours. You may shower after the first 24 hours.  Press the button if you feel a symptom. You will hear a small click. Record Date, Time and  Symptom in the Patient Logbook.  When you are ready to remove the patch, follow instructions on the last 2 pages of Patient  Logbook. Stick patch monitor onto the last page of Patient Logbook.  Place Patient Logbook in the blue and white box. Use locking tab on box and tape box closed  securely. The blue and white box has prepaid postage on it. Please place it in the mailbox as  soon as possible. Your physician should have your test results approximately 7 days after the  monitor has been mailed back to [facility].  Call [employer]  Care at [phone] if you have questions regarding  your ZIO XT patch monitor. Call them immediately if you see an orange light blinking on your  monitor.  If your monitor falls off in less than 4 days, contact our Monitor department at [phone].  If your monitor becomes loose or falls off after 4 days call [employer] at [phone] for  suggestions on securing your monitor    Follow-Up: At [facility], you and your health needs are our priority.  As part  of our continuing mission to provide you with exceptional heart care, we have created designated Provider Care Teams.  These Care Teams include your primary Cardiologist (physician) and Advanced Practice Providers (APPs -  Physician Assistants and Nurse Practitioners) who all work together to provide you with the care you need, when you need it.   Your next appointment:   9 month(s)  Provider:   Thomasene Ripple, DO

## 2023-08-01 DIAGNOSIS — I480 Paroxysmal atrial fibrillation: Secondary | ICD-10-CM | POA: Diagnosis not present

## 2023-08-01 DIAGNOSIS — I4891 Unspecified atrial fibrillation: Secondary | ICD-10-CM | POA: Diagnosis not present

## 2023-08-25 ENCOUNTER — Encounter: Payer: Self-pay | Admitting: Cardiology

## 2023-08-27 ENCOUNTER — Encounter: Payer: Self-pay | Admitting: Cardiology

## 2023-09-15 ENCOUNTER — Other Ambulatory Visit: Payer: Self-pay | Admitting: Physician Assistant

## 2023-09-20 IMAGING — CT CT CHEST HIGH RESOLUTION
2 of 7 series · 14 of 36 positions shown, 17 images · non-contrast
Comparison: None Available.

CLINICAL DATA: Shortness of breath, rule out interstitial lung
disease, history of COVID



[Series 4: high resolution · axial · 0.73mm/px · z∈[-312,-66]mm · 11 of 296 slices shown, 14 images]
[im 25/296  mediastinal]
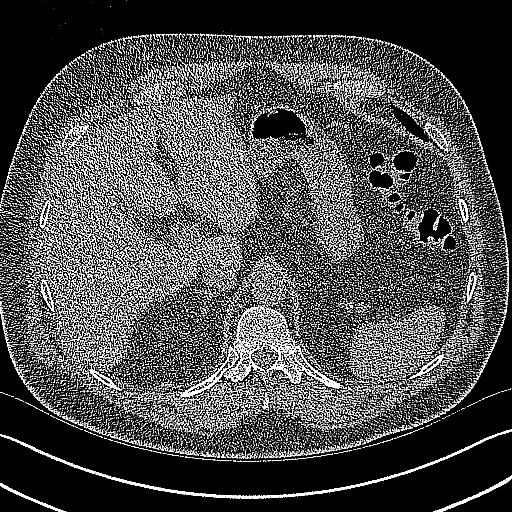
[im 25/296  lung]
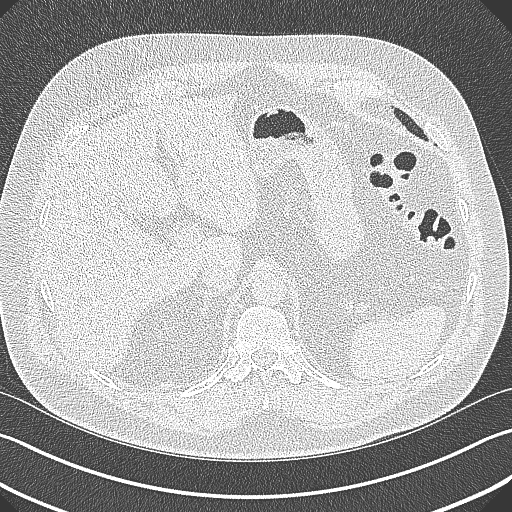
[im 50/296  lung]
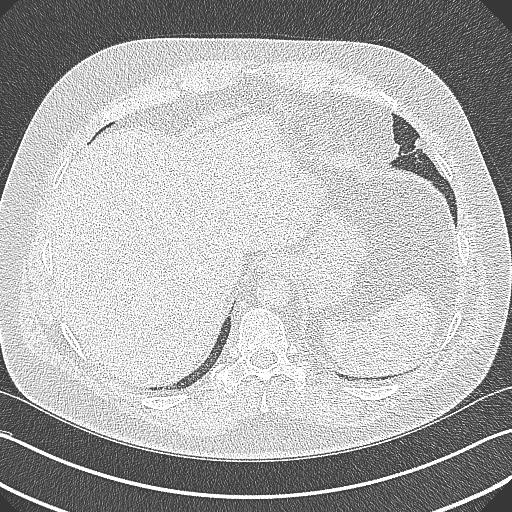
[im 74/296  lung]
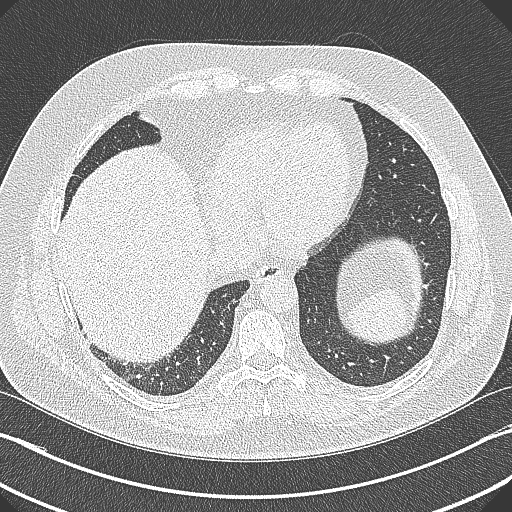
[im 99/296  lung]
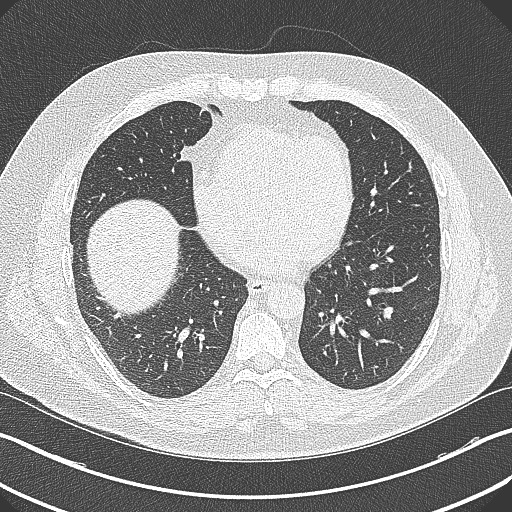
[im 123/296  mediastinal]
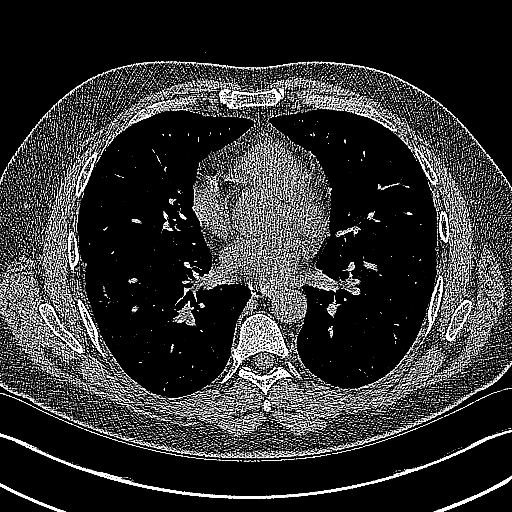
[im 123/296  lung]
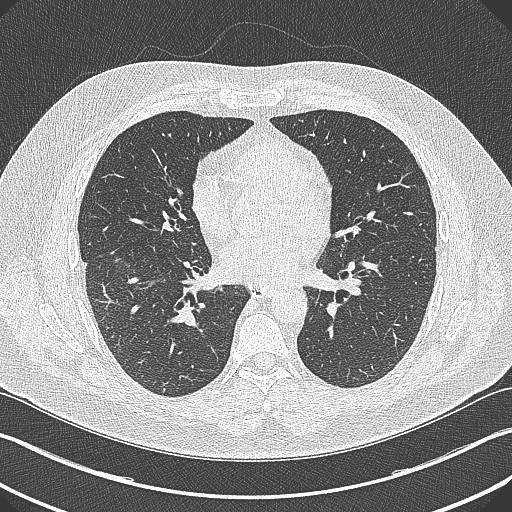
[im 148/296  lung]
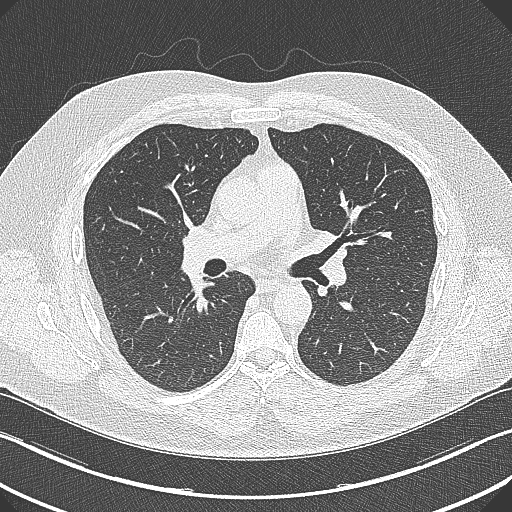
[im 173/296  lung]
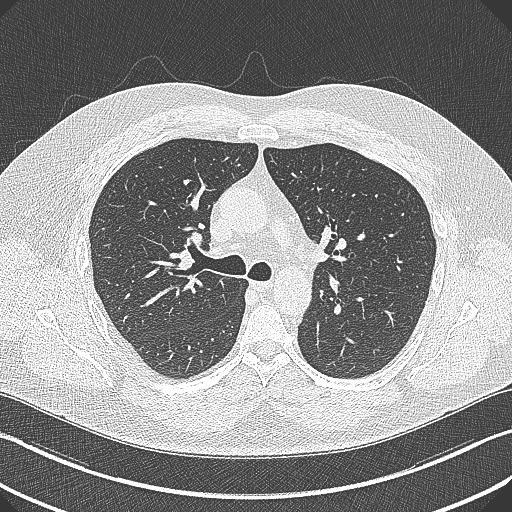
[im 197/296  lung]
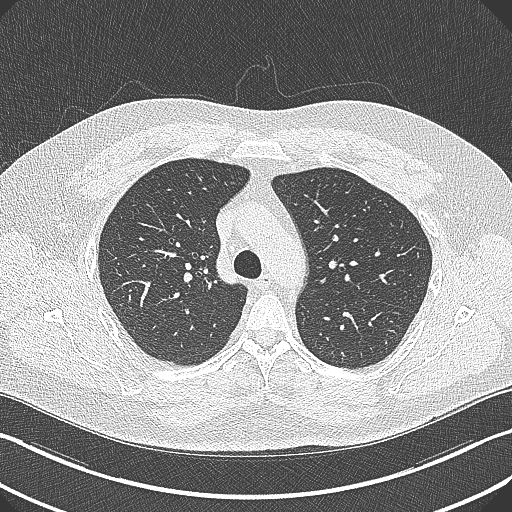
[im 222/296  mediastinal]
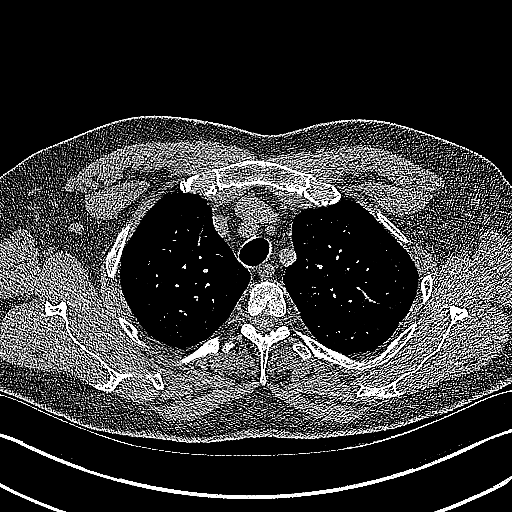
[im 222/296  lung]
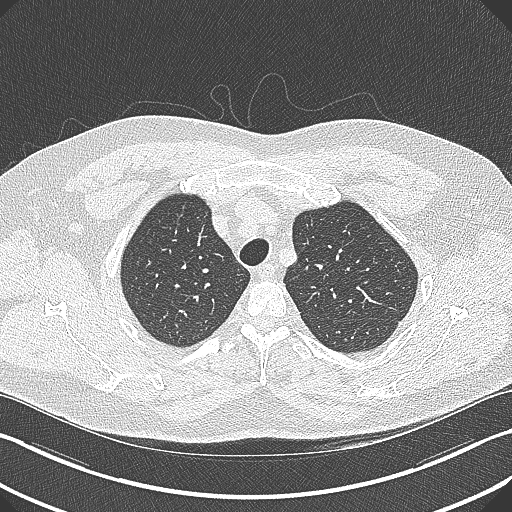
[im 246/296  lung]
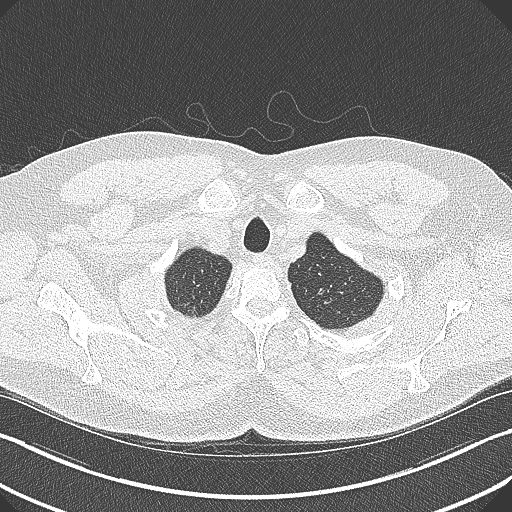
[im 271/296  lung]
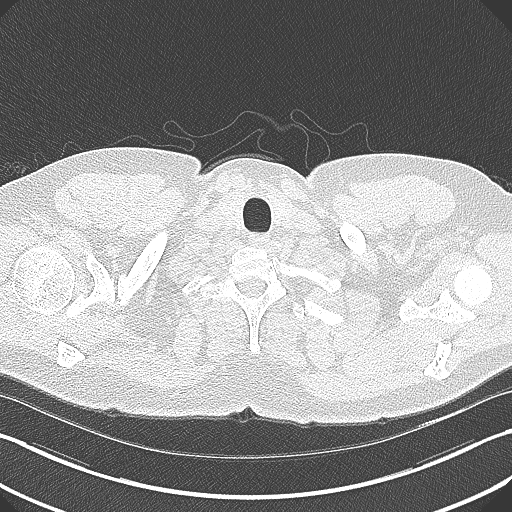

[Series 6: coronal · coronal · 0.60mm/px · 3 of 123 slices shown]
[im 25/123  lung]
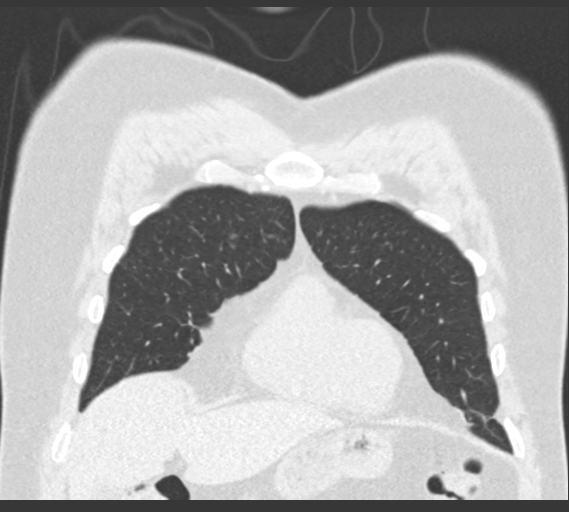
[im 49/123  lung]
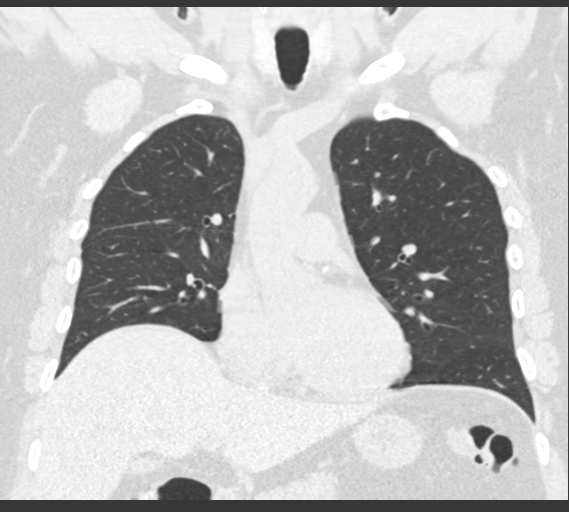
[im 74/123  lung]
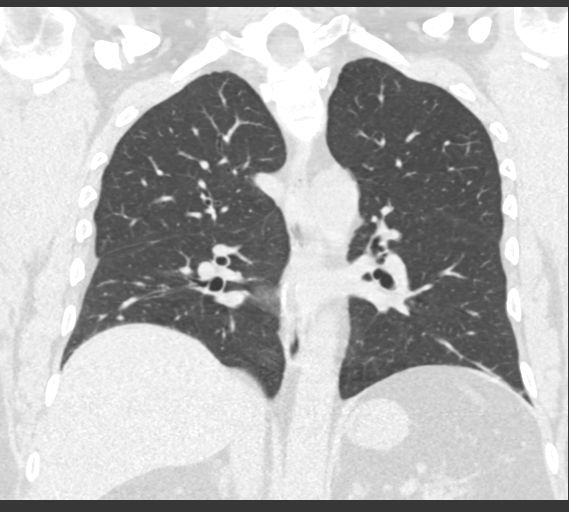

[14 of 36 positions shown; findings below may reference images not displayed]

FINDINGS: Cardiovascular: No significant vascular findings. Normal heart size.
Three-vessel coronary artery calcifications. No pericardial
effusion.

Mediastinum/Nodes: No enlarged mediastinal, hilar, or axillary lymph
nodes. Thyroid gland, trachea, and esophagus demonstrate no
significant findings.

Lungs/Pleura: Minimal irregular peripheral interstitial opacity at
the bilateral lung bases (series 4, image 227). Mild lobular air
trapping on expiratory phase imaging. No pleural effusion or
pneumothorax.

Upper Abdomen: No acute abnormality.

Musculoskeletal: No chest wall abnormality. No suspicious osseous
lesions identified.
IMPRESSION: 1. Minimal irregular peripheral interstitial opacity at the
bilateral lung bases, most suggestive of minimal, bland sequelae of
prior infection or aspiration. No specific findings to suggest
fibrotic interstitial lung disease.

2. Mild lobular air trapping on expiratory phase imaging, suggestive
of small airways disease.

3.  Coronary artery disease.

## 2023-10-02 ENCOUNTER — Encounter: Payer: Self-pay | Admitting: Cardiology

## 2023-10-26 NOTE — Telephone Encounter (Signed)
I spoke with the patient's wife.

## 2023-10-28 ENCOUNTER — Telehealth: Payer: Self-pay | Admitting: Student

## 2023-10-28 NOTE — Telephone Encounter (Addendum)
   Patient wife called Answering Service with concerns about BP.  Called and spoke with wife with patient in the background.  Patient's wife sent MyChart message earlier this week with concerns about low blood pressure.  It was recommended he switch is Toprol-XL to the evening.  I reviewed these notes.  Wife states patient came home last night with a headache and no energy.  He thought he was getting sick.  However, then he noticed that he was in atrial fibrillation.  He is symptomatic with his atrial fibrillation and describes an irregular heart rate but no tachycardia with this.  He was still in atrial fibrillation this morning based on readings from a family member's smart watch.  Rates are in the 50s.  However, his BP has been low this morning.  BP was 70/47 at 7:45am and then 79/48 a few minutes later and 98/50 shortly before calling.  He is asymptomatic with this.  No chest pain, shortness of breath, lightheadedness, dizziness, near syncope.  Headache has resolved.  He was previously instructed to take an extra half dose of Toprol-XL for persistent atrial fibrillation so he did take an additional 12.5 mg of this at 7:45 AM.  Recommended pushing fluids this morning and monitoring his BP closely.  As long as BP remains stable and he does not develop any symptoms of hypotension (lightheadedness, dizziness, near syncope), I think he can hold off on coming to the ED. However, reviewed ED precautions. Recommend he not take any more Toprol-XL today. He can resume this tomorrow evening as long as BP has remained stable (>100/55). Of note, he has not been on anticoagulation due to low CHA2DS2-VASc score.  Will route note to Dr. Servando Salina so that she is aware. Will also route note to [facility] RN to see if we could get patient into the [facility]Fib Clinic early next week.  Corrin Parker, PA-C 10/28/2023 8:36 AM  Patient's wife called back. She states patient is still in atrial fibrillation and he is not feeling great.  She states he is very tired and is actually currently taking a nap. She is not sure if this is because of the atrial fibrillation or because he did not sleep well last night. She wanted to know how long patient should wait before coming in if he is still in atrial fibrillation. Recommended coming to the ED if patient is feeling that poorly rather than waiting for an office visit since I don't know when we will actually be able to get him seen. He may need to be started on anticoagulation and cardioverted.  Corrin Parker, PA-C 10/28/2023 3:44 PM

## 2023-10-30 NOTE — Telephone Encounter (Signed)
 Appt made. Pt offered earlier appt but work scheduling unable to schedule before 3/4.  Pt was back in NSR in the call.

## 2023-11-07 ENCOUNTER — Ambulatory Visit (HOSPITAL_COMMUNITY)
Admission: RE | Admit: 2023-11-07 | Discharge: 2023-11-07 | Disposition: A | Discharge status: Home / Self Care | Payer: BC Managed Care – PPO | Source: Ambulatory Visit | Attending: Internal Medicine | Admitting: Internal Medicine

## 2023-11-07 VITALS — BP 122/70 | HR 70 | Ht 71.0 in | Wt 234.0 lb

## 2023-11-07 DIAGNOSIS — K219 Gastro-esophageal reflux disease without esophagitis: Secondary | ICD-10-CM | POA: Diagnosis not present

## 2023-11-07 DIAGNOSIS — G4733 Obstructive sleep apnea (adult) (pediatric): Secondary | ICD-10-CM | POA: Diagnosis not present

## 2023-11-07 DIAGNOSIS — I480 Paroxysmal atrial fibrillation: Secondary | ICD-10-CM | POA: Insufficient documentation

## 2023-11-07 DIAGNOSIS — Z79899 Other long term (current) drug therapy: Secondary | ICD-10-CM | POA: Diagnosis not present

## 2023-11-07 DIAGNOSIS — E785 Hyperlipidemia, unspecified: Secondary | ICD-10-CM | POA: Diagnosis not present

## 2023-11-07 NOTE — Progress Notes (Signed)
 Primary Care Physician: Vivien Presto, MD Primary Cardiologist: Thomasene Ripple, DO Electrophysiologist: None     Referring Physician: Marjie Skiff, PA-C     [name] is a 53 y.o. male with a history of asthma, GERD, OSA on CPAP, HLD, paroxysmal SVT, and paroxysmal atrial fibrillation who presents for consultation in the [facility]. Patient contacted answering service on 2/22 noting to be in Afib. At time of scheduling Afib clinic visit, he was noted to be back in NSR. He is taking Toprol 25 mg daily. Patient has a CHADS2VASC score of zero.  On evaluation today, he is currently in NSR. He was in Afib most recently for 27 hours. It was noted he was hypotensive so could not take any additional metoprolol. Wife notes patient historically tried diltiazem but it caused hypotension. She also noted he previously tried metoprolol 12.5 mg daily but it was insufficient to help his palpitations. He has been taking Toprol 25 mg at bedtime per recent suggestion by Cardiology.  Today, he denies symptoms of chest pain, shortness of breath, orthopnea, PND, lower extremity edema, dizziness, presyncope, syncope, snoring, daytime somnolence, bleeding, or neurologic sequela. The patient is tolerating medications without difficulties and is otherwise without complaint today.    Atrial Fibrillation Risk Factors:  he does have symptoms or diagnosis of sleep apnea. he is compliant with CPAP therapy.  he has a BMI of Body mass index is 32.64 kg/m.Marland Kitchen Filed Weights   11/07/23 1446  Weight: 106.1 kg    Current Outpatient Medications  Medication Sig Dispense Refill   famotidine (PEPCID) 20 MG tablet Take 20 mg by mouth daily.     metoprolol succinate (TOPROL-XL) 25 MG 24 hr tablet TAKE 1 TABLET (25 MG TOTAL) BY MOUTH DAILY. 90 tablet 3   Multiple Vitamins-Minerals (MULTIVITAMIN ADULTS PO) Take 1 tablet by mouth every morning.     rosuvastatin (CRESTOR) 10 MG tablet Take  10 mg by mouth daily.     Melatonin 3 MG CAPS Take 3 mg by mouth at bedtime.     No current facility-administered medications for this encounter.    Atrial Fibrillation Management history:  Previous antiarrhythmic drugs: none Previous cardioversions: none Previous ablations: none Anticoagulation history: none   ROS- All systems are reviewed and negative except as per the HPI above.  Physical Exam: BP 122/70   Pulse 70   Ht 5\' 11"  (1.803 m)   Wt 106.1 kg   BMI 32.64 kg/m   GEN: Well nourished, well developed in no acute distress NECK: No JVD; No carotid bruits CARDIAC: Regular rate and rhythm, no murmurs, rubs, gallops RESPIRATORY:  Clear to auscultation without rales, wheezing or rhonchi  ABDOMEN: Soft, non-tender, non-distended EXTREMITIES:  No edema; No deformity   EKG today demonstrates  Vent. rate 70 BPM PR interval 174 ms QRS duration 102 ms QT/QTcB 382/412 ms P-R-T axes 43 76 18 Normal sinus rhythm Normal ECG When compared with ECG of 28-Jul-2023 08:05, PREVIOUS ECG IS PRESENT  Echo 11/19/21 demonstrated  1. Left ventricular ejection fraction, by estimation, is 60 to 65%. The  left ventricle has normal function. The left ventricle has no regional  wall motion abnormalities. Left ventricular diastolic parameters were  normal.   2. Right ventricular systolic function is normal. The right ventricular  size is moderately enlarged. Tricuspid regurgitation signal is inadequate  for assessing PA pressure.   3. The mitral valve is normal in structure. No evidence of mitral valve  regurgitation.  No evidence of mitral stenosis.   4. The aortic valve is tricuspid. Aortic valve regurgitation is mild.  Aortic valve sclerosis is present, with no evidence of aortic valve  stenosis.   5. The inferior vena cava is normal in size with greater than 50%  respiratory variability, suggesting right atrial pressure of 3 mmHg.   ASSESSMENT & PLAN CHA2DS2-VASc Score = 0  The  patient's score is based upon: CHF History: 0 HTN History: 0 Diabetes History: 0 Stroke History: 0 Vascular Disease History: 0 Age Score: 0 Gender Score: 0       ASSESSMENT AND PLAN: Paroxysmal Atrial Fibrillation (ICD10:  I48.0) The patient's CHA2DS2-VASc score is 0, indicating a 0.2% annual risk of stroke.    He is currently in NSR. We discussed rate control vs rhythm control method for treatment of Afib. We had a long discussion about medication treatments and ablation in detail. We discussed AAD therapy and required monitoring. We also discussed pulse field ablation as an option in the form of intervention. After discussion, will continue with rate control for now. He seems to be hesitant about rhythm control at this time. Patient will continue to take Toprol 25 mg but will divide tablet and take 12.5 mg BID.     Follow up Afib clinic prn.    Lake Bells, PA-C  Afib Clinic [facility] [address] [place], [place] [address] [phone]

## 2023-11-22 ENCOUNTER — Encounter: Payer: Self-pay | Admitting: Cardiology

## 2023-11-27 MED ORDER — METOPROLOL SUCCINATE ER 25 MG PO TB24: 12.5000 mg | ORAL_TABLET | Freq: Every day | ORAL | 3 refills | Status: DC

## 2023-11-27 NOTE — Addendum Note (Signed)
 Addended by: Jeannette How A on: 11/27/2023 05:00 PM   Modules accepted: Orders

## 2024-06-21 ENCOUNTER — Ambulatory Visit (HOSPITAL_BASED_OUTPATIENT_CLINIC_OR_DEPARTMENT_OTHER): Admitting: Family

## 2024-06-21 ENCOUNTER — Encounter (HOSPITAL_BASED_OUTPATIENT_CLINIC_OR_DEPARTMENT_OTHER): Payer: Self-pay

## 2024-06-24 ENCOUNTER — Encounter (HOSPITAL_BASED_OUTPATIENT_CLINIC_OR_DEPARTMENT_OTHER): Payer: Self-pay | Admitting: Family

## 2024-06-24 ENCOUNTER — Ambulatory Visit (INDEPENDENT_AMBULATORY_CARE_PROVIDER_SITE_OTHER): Admitting: Family

## 2024-06-24 VITALS — BP 98/70 | HR 62 | Ht 71.0 in | Wt 225.0 lb

## 2024-06-24 DIAGNOSIS — I471 Supraventricular tachycardia, unspecified: Secondary | ICD-10-CM

## 2024-06-24 DIAGNOSIS — I480 Paroxysmal atrial fibrillation: Secondary | ICD-10-CM

## 2024-06-24 MED ORDER — METOPROLOL TARTRATE 25 MG PO TABS: ORAL_TABLET | ORAL | 3 refills | Status: DC

## 2024-06-24 NOTE — Progress Notes (Signed)
 Cardiology Office Note   Date:  06/24/2024  ID:  [name], DOB [birth year], MRN [id number] PCP: [name] Coye DELENA, MD  [facility] Providers Cardiologist:  Dub Huntsman, DO     History of Present Illness [name] is a 53 y.o. male with hx of PAF, asthma, GERD, OSA on CPAP, HLD, paroxysmal SVT, GAD.   ED visit 11/2021 with atrial fibrillation by EMS. He was given adenosine and converted. Echo 11/2021 normal LVEF 60-65%, moderate RV enlargement with normal function, mild AR. Monitor 11/2021 predominantly NSR with 9 runs of SVT longest 12 beats at 104 bpm which were asymptomatic. He was trialed on Diltiazem  but did not tolerate due to hypotension, transitioned to Toprol . Repeat monitor 08/2023 predominantly NSR, average HR 64 bpm, 11 runs SVT with longest 13.9 seconds at 106 bpm with no atrial fibrillation.   Presents today to transition to [facility] location. Notes he tires easily. 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 Inquires about not taking Toprol  as palpitations are infrequent. Last episode 1.5 months ago and prior to that 4 months between episodes of tachyarrhythmias. Monitors with Apple Watch. Reports triggers such as eating, but no particular foods. Endorses eating light breakfast, lunch then a more routine dinner. No chest pain, edema, near syncope, syncope, dyspnea. He notes mild lightheadedness after Buspar which improves after 30 minutes. Does note recent family stressors.   ROS: Please see the history of present illness.    All other systems reviewed and are negative.   Studies Reviewed      Cardiac Studies & Procedures   ______________________________________________________________________________________________     ECHOCARDIOGRAM  ECHOCARDIOGRAM COMPLETE 11/19/2021  Narrative ECHOCARDIOGRAM REPORT    Patient Name:   [name] Date of Exam: 11/19/2021 Medical Rec #:  [id number]       Height:       71.0 in Accession  #:    [id number]      Weight:       225.0 lb Date of Birth:  December 13, 1970       BSA:          2.217 m Patient Age:    50 years        BP:           128/90 mmHg Patient Gender: M               HR:           61 bpm. Exam Location:  [address]  Procedure: 2D Echo, Cardiac Doppler and Color Doppler  Indications:    I48.0 Paroxysmal atrial fibrillation  History:        Patient has no prior history of Echocardiogram examinations. Risk Factors:Dyslipidemia.  Sonographer:    Carl Rodgers-Jones RDCS Referring Phys: KARDIE TOBB  IMPRESSIONS   1. Left ventricular ejection fraction, by estimation, is 60 to 65%. The left ventricle has normal function. The left ventricle has no regional wall motion abnormalities. Left ventricular diastolic parameters were normal. 2. Right ventricular systolic function is normal. The right ventricular size is moderately enlarged. Tricuspid regurgitation signal is inadequate for assessing PA pressure. 3. The mitral valve is normal in structure. No evidence of mitral valve regurgitation. No evidence of mitral stenosis. 4. The aortic valve is tricuspid. Aortic valve regurgitation is mild. Aortic valve sclerosis is present, with no evidence of aortic valve stenosis. 5. The inferior vena cava is normal in size with greater than 50% respiratory variability, suggesting right atrial pressure of 3  mmHg.  FINDINGS Left Ventricle: Left ventricular ejection fraction, by estimation, is 60 to 65%. The left ventricle has normal function. The left ventricle has no regional wall motion abnormalities. The left ventricular internal cavity size was normal in size. There is no left ventricular hypertrophy. Left ventricular diastolic parameters were normal. Normal left ventricular filling pressure.  Right Ventricle: The right ventricular size is moderately enlarged. No increase in right ventricular wall thickness. Right ventricular systolic function is normal. Tricuspid regurgitation  signal is inadequate for assessing PA pressure.  Left Atrium: Left atrial size was normal in size.  Right Atrium: Right atrial size was normal in size.  Pericardium: There is no evidence of pericardial effusion.  Mitral Valve: The mitral valve is normal in structure. No evidence of mitral valve regurgitation. No evidence of mitral valve stenosis.  Tricuspid Valve: The tricuspid valve is normal in structure. Tricuspid valve regurgitation is not demonstrated. No evidence of tricuspid stenosis.  Aortic Valve: The aortic valve is tricuspid. Aortic valve regurgitation is mild. Aortic valve sclerosis is present, with no evidence of aortic valve stenosis.  Pulmonic Valve: The pulmonic valve was normal in structure. Pulmonic valve regurgitation is trivial. No evidence of pulmonic stenosis.  Aorta: The aortic root is normal in size and structure.  Venous: The inferior vena cava is normal in size with greater than 50% respiratory variability, suggesting right atrial pressure of 3 mmHg.  IAS/Shunts: No atrial level shunt detected by color flow Doppler.   LEFT VENTRICLE PLAX 2D LVIDd:         4.40 cm   Diastology LVIDs:         3.10 cm   LV e' medial:    9.20 cm/s LV PW:         1.00 cm   LV E/e' medial:  6.6 LV IVS:        0.80 cm   LV e' lateral:   13.95 cm/s LVOT diam:     2.20 cm   LV E/e' lateral: 4.4 LV SV:         96 LV SV Index:   43 LVOT Area:     3.80 cm   RIGHT VENTRICLE             IVC RV Basal diam:  4.70 cm     IVC diam: 1.80 cm RV S prime:     12.50 cm/s TAPSE (M-mode): 1.6 cm  LEFT ATRIUM             Index        RIGHT ATRIUM           Index LA diam:        4.30 cm 1.94 cm/m   RA Area:     14.00 cm LA Vol (A2C):   64.9 ml 29.28 ml/m  RA Volume:   38.10 ml  17.19 ml/m LA Vol (A4C):   33.7 ml 15.20 ml/m LA Biplane Vol: 50.7 ml 22.87 ml/m AORTIC VALVE LVOT Vmax:   121.50 cm/s LVOT Vmean:  81.500 cm/s LVOT VTI:    0.252 m  AORTA Ao Root diam: 3.60 cm Ao Asc  diam:  3.30 cm  MITRAL VALVE MV Area (PHT): 3.42 cm    SHUNTS MV Decel Time: 222 msec    Systemic VTI:  0.25 m MV E velocity: 61.00 cm/s  Systemic Diam: 2.20 cm MV A velocity: 50.40 cm/s MV E/A ratio:  1.21  Wilbert Bihari MD Electronically signed by Wilbert Bihari MD Signature Date/Time:  11/19/2021/4:46:06 PM    Final    MONITORS  LONG TERM MONITOR (3-14 DAYS) 08/24/2023  Narrative Patch Wear Time:  13 days and 10 hours (2024-11-26T19:45:46-0500 to 2024-12-10T05:58:24-0500)  Patient had a min HR of 41 bpm, max HR of 197 bpm, and avg HR of 64 bpm. Predominant underlying rhythm was Sinus Rhythm.  11 Supraventricular Tachycardia runs occurred, the run with the fastest interval lasting 6 beats with a max rate of 197 bpm, the longest lasting 13.9 secs with an avg rate of 106 bpm. Isolated SVEs were rare (<1.0%), SVE Couplets were rare (<1.0%), and SVE Triplets were rare (<1.0%). Isolated VEs were rare (<1.0%), VE Couplets were rare (<1.0%), and no VE Triplets were present.  No symptoms reported.  Conclusion: This study shows the following. 1. Paroxysmal supraventricular tachycardia.       ______________________________________________________________________________________________      Risk Assessment/Calculations  CHA2DS2-VASc Score = 0   This indicates a 0.2% annual risk of stroke. The patient's score is based upon: CHF History: 0 HTN History: 0 Diabetes History: 0 Stroke History: 0 Vascular Disease History: 0 Age Score: 0 Gender Score: 0             Physical Exam VS:  BP 98/70 (BP Location: Left Arm, Patient Position: Sitting, Cuff Size: Normal)   Pulse 62   Ht 5' 11 (1.803 m)   Wt 225 lb (102.1 kg)   SpO2 98%   BMI 31.38 kg/m        Wt Readings from Last 3 Encounters:  06/24/24 225 lb (102.1 kg)  11/07/23 234 lb (106.1 kg)  07/28/23 230 lb 6.4 oz (104.5 kg)    GEN: Well nourished, well developed in no acute distress NECK: No JVD; No carotid  bruits CARDIAC: RRR, no murmurs, rubs, gallops RESPIRATORY:  Clear to auscultation without rales, wheezing or rhonchi  ABDOMEN: Soft, non-tender, non-distended EXTREMITIES:  No edema; No deformity   ASSESSMENT AND PLAN  PAF / SVT - prior ED visit 11/2021 with PAF. Two monitors since that time with SVT but no PAF. Does not meet criteria for OAC. intermittent palpitations. Would like to be off Toprol  in case contributory to fatigue. Stop Toprol . Rx Lopressor  12.5mg  PRN for palpitations. If palpitations last longer than 30 minutes and HR>110bpm, he will take another 12.5mg . if palpitations become more frequent, consider alternative long acting beta blocker such as bisoprolol or propranolol ER. HLD - Continue rosuvastatin 10mg  daily per PCP. GAD - management per PCP       Dispo: follow up in 1 year  Signed, Reche GORMAN Finder, NP

## 2024-06-24 NOTE — Patient Instructions (Addendum)
 Medication Instructions:  STOP Metoprolol  Succinate  START Metoprolol  Tartrate 12.5mg  (half tablet) as needed for palpitations. If your palpitations persist for more than 30 minutes, take another half tablet.   *If you need a refill on your cardiac medications before your next appointment, please call your pharmacy*  Follow-Up: At [facility], you and your health needs are our priority.  As part of our continuing mission to provide you with exceptional heart care, our providers are all part of one team.  This team includes your primary Cardiologist (physician) and Advanced Practice Providers or APPs (Physician Assistants and Nurse Practitioners) who all work together to provide you with the care you need, when you need it.  Your next appointment:   1 year(s)  Provider:   Reche Finder, NP    We recommend signing up for the patient portal called MyChart.  Sign up information is provided on this After Visit Summary.  MyChart is used to connect with patients for Virtual Visits (Telemedicine).  Patients are able to view lab/test results, encounter notes, upcoming appointments, etc.  Non-urgent messages can be sent to your provider as well.   To learn more about what you can do with MyChart, go to [email or web address].   Other Instructions  If your palpitations increase in frequency or severity when not taking Metoprolol  Succinate, let us  know and we can consider resuming Metoprolol  Succinate or trial of a different beta blocker.  To prevent palpitations: Make sure you are adequately hydrated.  Avoid and/or limit caffeine containing beverages like soda or tea. Exercise regularly.  Manage stress well. Some over the counter medications can cause palpitations such as Benadryl, AdvilPM, TylenolPM. Regular Advil or Tylenol  do not cause palpitations.

## 2024-06-30 ENCOUNTER — Encounter (HOSPITAL_BASED_OUTPATIENT_CLINIC_OR_DEPARTMENT_OTHER): Payer: Self-pay

## 2024-07-01 NOTE — Telephone Encounter (Signed)
 Start Bisoprolol 5mg  daily. If recurrent issues with atrial fibrillation despite Bisoprolol, would suggest follow up with AFib clinic.   Chrislyn Seedorf S Affan Callow, NP

## 2024-07-02 MED ORDER — BISOPROLOL FUMARATE 5 MG PO TABS: 5.0000 mg | ORAL_TABLET | Freq: Every day | ORAL | 3 refills | Status: AC

## 2024-07-02 MED ORDER — BISOPROLOL FUMARATE 5 MG PO TABS: 5.0000 mg | ORAL_TABLET | Freq: Every day | ORAL | 3 refills | Status: DC

## 2024-10-02 ENCOUNTER — Encounter (HOSPITAL_BASED_OUTPATIENT_CLINIC_OR_DEPARTMENT_OTHER): Payer: Self-pay

## 2024-10-03 ENCOUNTER — Ambulatory Visit (HOSPITAL_BASED_OUTPATIENT_CLINIC_OR_DEPARTMENT_OTHER): Admitting: Nurse Practitioner
